# Patient Record
Sex: Male | Born: 1985 | Race: Black or African American | Hispanic: No | Marital: Single | Smoking: Never smoker
Health system: Southern US, Community
[De-identification: ages and names within clinical notes are randomized; demographics above are authoritative.]

## PROBLEM LIST (undated history)

## (undated) DIAGNOSIS — D573 Sickle-cell trait: Secondary | ICD-10-CM

## (undated) DIAGNOSIS — C966 Unifocal Langerhans-cell histiocytosis: Secondary | ICD-10-CM

## (undated) DIAGNOSIS — M7660 Achilles tendinitis, unspecified leg: Secondary | ICD-10-CM

## (undated) HISTORY — PX: CHOLECYSTECTOMY: SHX55

## (undated) HISTORY — PX: TYMPANOSTOMY TUBE PLACEMENT: SHX32

## (undated) HISTORY — PX: KNEE SURGERY: SHX244

---

## 2006-11-19 ENCOUNTER — Emergency Department (HOSPITAL_COMMUNITY): Admission: EM | Admit: 2006-11-19 | Discharge: 2006-11-19 | Payer: Self-pay | Admitting: Emergency Medicine

## 2006-11-22 ENCOUNTER — Emergency Department (HOSPITAL_COMMUNITY): Admission: EM | Admit: 2006-11-22 | Discharge: 2006-11-22 | Payer: Self-pay | Admitting: Emergency Medicine

## 2007-12-22 ENCOUNTER — Emergency Department (HOSPITAL_COMMUNITY): Admission: EM | Admit: 2007-12-22 | Discharge: 2007-12-22 | Payer: Self-pay | Admitting: Emergency Medicine

## 2008-10-27 ENCOUNTER — Emergency Department (HOSPITAL_COMMUNITY): Admission: EM | Admit: 2008-10-27 | Discharge: 2008-10-27 | Payer: Self-pay | Admitting: Emergency Medicine

## 2009-12-29 ENCOUNTER — Encounter: Admission: RE | Admit: 2009-12-29 | Discharge: 2009-12-29 | Payer: Self-pay | Admitting: Family Medicine

## 2011-04-12 ENCOUNTER — Ambulatory Visit
Admission: RE | Admit: 2011-04-12 | Discharge: 2011-04-12 | Disposition: A | Discharge status: Home / Self Care | Source: Ambulatory Visit | Attending: Emergency Medicine | Admitting: Emergency Medicine

## 2011-04-12 ENCOUNTER — Other Ambulatory Visit: Payer: Self-pay | Admitting: Emergency Medicine

## 2011-04-12 DIAGNOSIS — R52 Pain, unspecified: Secondary | ICD-10-CM

## 2013-06-21 ENCOUNTER — Encounter (HOSPITAL_COMMUNITY): Payer: Self-pay | Admitting: Emergency Medicine

## 2013-06-21 ENCOUNTER — Emergency Department (HOSPITAL_COMMUNITY)
Admission: EM | Admit: 2013-06-21 | Discharge: 2013-06-21 | Disposition: A | Discharge status: Home / Self Care | Attending: Emergency Medicine | Admitting: Emergency Medicine

## 2013-06-21 DIAGNOSIS — Z882 Allergy status to sulfonamides status: Secondary | ICD-10-CM | POA: Insufficient documentation

## 2013-06-21 DIAGNOSIS — R0982 Postnasal drip: Secondary | ICD-10-CM | POA: Insufficient documentation

## 2013-06-21 DIAGNOSIS — J029 Acute pharyngitis, unspecified: Secondary | ICD-10-CM

## 2013-06-21 DIAGNOSIS — R1310 Dysphagia, unspecified: Secondary | ICD-10-CM | POA: Insufficient documentation

## 2013-06-21 DIAGNOSIS — J309 Allergic rhinitis, unspecified: Secondary | ICD-10-CM | POA: Insufficient documentation

## 2013-06-21 MED ORDER — PREDNISONE 10 MG PO TABS: 60.0000 mg | ORAL_TABLET | Freq: Every day | ORAL | Status: DC

## 2013-06-21 MED ORDER — PREDNISONE 20 MG PO TABS
60.0000 mg | ORAL_TABLET | Freq: Once | ORAL | Status: AC
  Administered 2013-06-21: 60 mg via ORAL
  Filled 2013-06-21: qty 3

## 2013-06-21 NOTE — ED Notes (Signed)
Pt reports he was seen at his dentist on Wednesday, pt states his dentist reported seeing something in the back of his throat and wanted pt to have it evaluated. Pt does not remember what the dentist said it could be, pt denies any pain but does report difficulty swallowing certain foods such as meats.

## 2013-06-21 NOTE — ED Provider Notes (Signed)
CSN: [id number]     Arrival date & time 06/21/13  1524 History   First MD Initiated Contact with Patient 06/21/13 1603    This chart was scribed for Irish Elders NP, a non-physician practitioner working with No att. providers found by Lewanda Rife, ED Scribe. This patient was seen in room TR05C/TR05C and the patient's care was started at 10:45 PM     Chief Complaint  Patient presents with  . Sore Throat   (Consider location/radiation/quality/duration/timing/severity/associated sxs/prior Treatment) The history is provided by the patient. No language interpreter was used.   HPI Comments: Adam Prince is a 27 y.o. 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 Reports associated recent mild dysphagia "with meats", and improving recent URI. Denies any aggravating factors and alleviating factors. Denies associated any pain, fever, cough, sore throat, difficulty breathing, and chest pain. Denies PMHx of GERD or other pertinent hx. Reports seasonal allergies and allergies to Sulfa.   History reviewed. No pertinent past medical history. Past Surgical History  Procedure Laterality Date  . Knee surgery    . Tympanostomy tube placement     History reviewed. No pertinent family history. History  Substance Use Topics  . Smoking status: Never Smoker   . Smokeless tobacco: Not on file  . Alcohol Use: Yes     Comment: social    Review of Systems  Constitutional: Negative for fever.  HENT: Positive for sore throat.   All other systems reviewed and are negative.   A complete 10 system review of systems was obtained and all systems are negative except as noted in the HPI and PMHx.    Allergies  Sulfa antibiotics  Home Medications   Current Outpatient Rx  Name  Route  Sig  Dispense  Refill  . Ascorbic Acid (VITAMIN C PO)   Oral   Take 1 tablet by mouth daily.         . predniSONE  (DELTASONE) 10 MG tablet   Oral   Take 6 tablets (60 mg total) by mouth daily.   18 tablet   0    BP 117/87  Pulse 72  Temp(Src) 97.5 F (36.4 C) (Oral)  Resp 16  Ht 5\' 6"  (1.676 m)  Wt 163 lb (73.936 kg)  BMI 26.32 kg/m2  SpO2 100% Physical Exam  Nursing note and vitals reviewed. Constitutional: He is oriented to person, place, and time. He appears well-developed and well-nourished. No distress.  HENT:  Head: Normocephalic and atraumatic.  Right Ear: Tympanic membrane, external ear and ear canal normal.  Left Ear: External ear normal.  Mouth/Throat: Uvula is midline and mucous membranes are normal. No oral lesions. No trismus in the jaw. No uvula swelling. Posterior oropharyngeal edema (mild ) present. No oropharyngeal exudate, posterior oropharyngeal erythema or tonsillar abscesses.  Eyes: EOM are normal.  Neck: Neck supple. No tracheal deviation present.  Cardiovascular: Normal rate.   Pulmonary/Chest: Effort normal. No respiratory distress.  Musculoskeletal: Normal range of motion.  Lymphadenopathy:       Head (right side): No submental and no submandibular adenopathy present.       Head (left side): No submental and no submandibular adenopathy present.    He has no cervical adenopathy.  Neurological: He is alert and oriented to person, place, and time.  Skin: Skin is warm and dry.  Psychiatric: He has a normal mood and affect. His behavior is normal.  ED Course  Procedures  COORDINATION OF CARE:  Nursing notes reviewed. Vital signs reviewed. Initial pt interview and examination performed.  10:45 PM Pt informed of return precautions, f/u with ENT, and is comfortable with discharge at this time.    EKG Interpretation   None       MDM   1. Pharyngitis    Recent history of URI. No fever, chills, N/V/D or headache. Mild soreness described in his throat and he has had some post-nasal drip. Denies difficulty breathing, wheezing or shortness of breath.  Reports  some difficulty swallowing large bites of meat. Prednisone 60mg  given here and sent home with prescription for 3 days of prednisone. ENT referral, if not remarkably better after prednisone. Discussed eating soft foods and small bites of food. Return for any increased swelling, discomfort, fever, hoarseness or difficulty breathing. Pt understands plan and agrees.   I personally performed the services described in this documentation, which was scribed in my presence. The recorded information has been reviewed and is accurate.    Irish Elders, NP 06/21/13 2204  Adam Pound. Raahil Ong, MD 06/21/13 2245

## 2013-12-30 ENCOUNTER — Encounter (HOSPITAL_COMMUNITY): Payer: Self-pay | Admitting: Emergency Medicine

## 2013-12-30 ENCOUNTER — Emergency Department (INDEPENDENT_AMBULATORY_CARE_PROVIDER_SITE_OTHER)
Admission: EM | Admit: 2013-12-30 | Discharge: 2013-12-30 | Disposition: A | Discharge status: Home / Self Care | Source: Home / Self Care | Attending: Emergency Medicine | Admitting: Emergency Medicine

## 2013-12-30 DIAGNOSIS — J069 Acute upper respiratory infection, unspecified: Secondary | ICD-10-CM

## 2013-12-30 MED ORDER — IPRATROPIUM BROMIDE 0.06 % NA SOLN: 2.0000 | Freq: Four times a day (QID) | NASAL | Status: DC

## 2013-12-30 MED ORDER — DEXTROMETHORPHAN POLISTIREX 30 MG/5ML PO LQCR
30.0000 mg | Freq: Two times a day (BID) | ORAL | Status: DC
Start: 2013-12-30 — End: 2015-09-04

## 2013-12-30 NOTE — ED Notes (Signed)
Patient complains of head pressure, stuffy nose, sore throat, back ache, body aches.

## 2013-12-30 NOTE — Discharge Instructions (Signed)

## 2013-12-30 NOTE — ED Provider Notes (Signed)
CSN: [id number]     Arrival date & time 12/30/13  1747 History   First MD Initiated Contact with Patient 12/30/13 1929     Chief Complaint  Patient presents with  . 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 male presenting with URI. The history is provided by the patient.  URI Presenting symptoms: congestion, cough, rhinorrhea and sore throat   Severity:  Mild Onset quality:  Gradual Duration:  3 days Timing:  Constant Progression:  Unchanged Chronicity:  New Associated symptoms: headaches and myalgias   Associated symptoms: no arthralgias, no neck pain, no sinus pain, no sneezing, no swollen glands and no wheezing     History reviewed. No pertinent past medical history. Past Surgical History  Procedure Laterality Date  . Knee surgery    . Tympanostomy tube placement     No family history on file. History  Substance Use Topics  . Smoking status: Never Smoker   . Smokeless tobacco: Not on file  . Alcohol Use: Yes     Comment: social    Review of Systems  Constitutional: Negative.   HENT: Positive for congestion, rhinorrhea and sore throat. Negative for sneezing.   Eyes: Negative.   Respiratory: Positive for cough. Negative for wheezing.   Cardiovascular: Negative.   Gastrointestinal: Negative.   Genitourinary: Negative.   Musculoskeletal: Positive for myalgias. Negative for arthralgias and neck pain.  Skin: Negative.   Neurological: Positive for headaches.    Allergies  Sulfa antibiotics  Home Medications   Prior to Admission medications   Medication Sig Start Date End Date Taking? Authorizing Provider  Ascorbic Acid (VITAMIN C PO) Take 1 tablet by mouth daily.    Historical Provider, MD  dextromethorphan (DELSYM) 30 MG/5ML liquid Take 5 mLs (30 mg total) by mouth 2 (two) times daily. As needed for  cough 12/30/13   Annett Gula Lillyanne Bradburn, PA  ipratropium (ATROVENT) 0.06 % nasal spray Place 2 sprays into both nostrils 4 (four) times daily. As needed for nasal congestion 12/30/13   Lahoma Rocker, PA  predniSONE (DELTASONE) 10 MG tablet Take 6 tablets (60 mg total) by mouth daily. 06/21/13   Elisha Headland, NP   BP 116/79  Pulse 74  Temp(Src) 98.4 F (36.9 C) (Oral)  Resp 14  SpO2 98% Physical Exam  Nursing note and vitals reviewed. Constitutional: He is oriented to person, place, and time. He appears well-developed and well-nourished. No distress.  HENT:  Head: Normocephalic and atraumatic.  Right Ear: Hearing, tympanic membrane, external ear and ear canal normal.  Left Ear: Hearing, tympanic membrane, external ear and ear canal normal.  Nose: Nose normal.  Mouth/Throat: Uvula is midline, oropharynx is clear and moist and mucous membranes are normal. No oral lesions. No trismus in the jaw.  Eyes: Conjunctivae are normal. No scleral icterus.  Neck: Normal range of motion. Neck supple.  Cardiovascular: Normal rate, regular rhythm and normal heart sounds.   Pulmonary/Chest: Effort normal and breath sounds normal. No respiratory distress.  Musculoskeletal: Normal range of motion.  Lymphadenopathy:    He has no cervical adenopathy.  Neurological: He is alert and oriented to person, place, and time.  Skin: Skin is warm and dry. No rash noted. No erythema.  Psychiatric: He has a normal mood and affect. His behavior is normal.    ED Course  Procedures (including critical care time) Labs Review Labs Reviewed - No  data to display  Imaging Review No results found.   MDM   1. URI (upper respiratory infection)    Atrovent nasal spray as directed for nasal congestion, Delsym as directed on packaging for cough with symptomatic care at home.   [place], [place] 12/30/13 1958

## 2013-12-31 NOTE — ED Provider Notes (Signed)
Medical screening examination/treatment/procedure(s) were performed by non-physician practitioner and as supervising physician I was immediately available for consultation/collaboration.  Philipp Deputy, M.D.  Harden Mo, MD 12/31/13 [phone]

## 2014-07-25 DIAGNOSIS — M7660 Achilles tendinitis, unspecified leg: Secondary | ICD-10-CM

## 2014-07-25 HISTORY — DX: Achilles tendinitis, unspecified leg: M76.60

## 2014-12-15 ENCOUNTER — Emergency Department (HOSPITAL_COMMUNITY)
Admission: EM | Admit: 2014-12-15 | Discharge: 2014-12-15 | Disposition: A | Discharge status: Home / Self Care | Attending: Emergency Medicine | Admitting: Emergency Medicine

## 2014-12-15 ENCOUNTER — Emergency Department (HOSPITAL_COMMUNITY)

## 2014-12-15 ENCOUNTER — Encounter (HOSPITAL_COMMUNITY): Payer: Self-pay | Admitting: *Deleted

## 2014-12-15 DIAGNOSIS — N289 Disorder of kidney and ureter, unspecified: Secondary | ICD-10-CM | POA: Insufficient documentation

## 2014-12-15 DIAGNOSIS — R059 Cough, unspecified: Secondary | ICD-10-CM

## 2014-12-15 DIAGNOSIS — Z79899 Other long term (current) drug therapy: Secondary | ICD-10-CM | POA: Insufficient documentation

## 2014-12-15 DIAGNOSIS — R079 Chest pain, unspecified: Secondary | ICD-10-CM | POA: Diagnosis present

## 2014-12-15 DIAGNOSIS — R05 Cough: Secondary | ICD-10-CM | POA: Diagnosis not present

## 2014-12-15 LAB — COMPREHENSIVE METABOLIC PANEL
ALK PHOS: 79 U/L (ref 38–126)
ALT: 32 U/L (ref 17–63)
AST: 19 U/L (ref 15–41)
Albumin: 3.8 g/dL (ref 3.5–5.0)
Anion gap: 10 (ref 5–15)
BILIRUBIN TOTAL: 0.6 mg/dL (ref 0.3–1.2)
BUN: 8 mg/dL (ref 6–20)
CALCIUM: 8.9 mg/dL (ref 8.9–10.3)
CO2: 28 mmol/L (ref 22–32)
CREATININE: 1.34 mg/dL — AB (ref 0.61–1.24)
Chloride: 102 mmol/L (ref 101–111)
GFR calc Af Amer: >60 mL/min (ref >60)
Glucose, Bld: 70 mg/dL (ref 65–99)
POTASSIUM: 3.4 mmol/L — AB (ref 3.5–5.1)
SODIUM: 140 mmol/L (ref 135–145)
Total Protein: 6.2 g/dL — ABNORMAL LOW (ref 6.5–8.1)

## 2014-12-15 LAB — URINALYSIS, ROUTINE W REFLEX MICROSCOPIC
Bilirubin Urine: NEGATIVE
Glucose, UA: NEGATIVE mg/dL
Hgb urine dipstick: NEGATIVE
KETONES UR: NEGATIVE mg/dL
LEUKOCYTES UA: NEGATIVE
Nitrite: NEGATIVE
PROTEIN: NEGATIVE mg/dL
SPECIFIC GRAVITY, URINE: 1.009 (ref 1.005–1.030)
UROBILINOGEN UA: 0.2 mg/dL (ref 0.0–1.0)
pH: 7 (ref 5.0–8.0)

## 2014-12-15 LAB — CBC
HEMATOCRIT: 42.4 % (ref 39.0–52.0)
Hemoglobin: 14.4 g/dL (ref 13.0–17.0)
MCH: 28.8 pg (ref 26.0–34.0)
MCHC: 34 g/dL (ref 30.0–36.0)
MCV: 84.8 fL (ref 78.0–100.0)
PLATELETS: 594 10*3/uL — AB (ref 150–400)
RBC: 5 MIL/uL (ref 4.22–5.81)
RDW: 13.6 % (ref 11.5–15.5)
WBC: 10.3 10*3/uL (ref 4.0–10.5)

## 2014-12-15 LAB — TROPONIN I: Troponin I: <0.03 ng/mL (ref <0.031)

## 2014-12-15 NOTE — ED Notes (Addendum)
Pt c/o chest cough and sore throat x 2 weeks.  Presently denies sore throat, but c/o L sided chest pain that started after coughing.  Pt was seen at [facility] last week and given prednisone, cough medicine and an albuterol inhaler with no relief.

## 2014-12-15 NOTE — Discharge Instructions (Signed)
DASH Eating Plan °DASH stands for "Dietary Approaches to Stop Hypertension." The DASH eating plan is a healthy eating plan that has been shown to reduce high blood pressure (hypertension). Additional health benefits may include reducing the risk of type 2 diabetes mellitus, heart disease, and stroke. The DASH eating plan may also help with weight loss. °WHAT DO I NEED TO KNOW ABOUT THE DASH EATING PLAN? °For the DASH eating plan, you will follow these general guidelines: °· Choose foods with a percent daily value for sodium of less than 5% (as listed on the food label). °· Use salt-free seasonings or herbs instead of table salt or sea salt. °· Check with your health care provider or pharmacist before using salt substitutes. °· Eat lower-sodium products, often labeled as "lower sodium" or "no salt added." °· Eat fresh foods. °· Eat more vegetables, fruits, and low-fat dairy products. °· Choose whole grains. Look for the word "whole" as the first word in the ingredient list. °· Choose fish and skinless chicken or turkey more often than red meat. Limit fish, poultry, and meat to 6 oz (170 g) each day. °· Limit sweets, desserts, sugars, and sugary drinks. °· Choose heart-healthy fats. °· Limit cheese to 1 oz (28 g) per day. °· Eat more home-cooked food and less restaurant, buffet, and fast food. °· Limit fried foods. °· Cook foods using methods other than frying. °· Limit canned vegetables. If you do use them, rinse them well to decrease the sodium. °· When eating at a restaurant, ask that your food be prepared with less salt, or no salt if possible. °WHAT FOODS CAN I EAT? °Seek help from a dietitian for individual calorie needs. °Grains °Whole grain or whole wheat bread. Brown rice. Whole grain or whole wheat pasta. Quinoa, bulgur, and whole grain cereals. Low-sodium cereals. Corn or whole wheat flour tortillas. Whole grain cornbread. Whole grain crackers. Low-sodium crackers. °Vegetables °Fresh or frozen vegetables  (raw, steamed, roasted, or grilled). Low-sodium or reduced-sodium tomato and vegetable juices. Low-sodium or reduced-sodium tomato sauce and paste. Low-sodium or reduced-sodium canned vegetables.  °Fruits °All fresh, canned (in natural juice), or frozen fruits. °Meat and Other Protein Products °Ground beef (85% or leaner), grass-fed beef, or beef trimmed of fat. Skinless chicken or turkey. Ground chicken or turkey. Pork trimmed of fat. All fish and seafood. Eggs. Dried beans, peas, or lentils. Unsalted nuts and seeds. Unsalted canned beans. °Dairy °Low-fat dairy products, such as skim or 1% milk, 2% or reduced-fat cheeses, low-fat ricotta or cottage cheese, or plain low-fat yogurt. Low-sodium or reduced-sodium cheeses. °Fats and Oils °Tub margarines without trans fats. Light or reduced-fat mayonnaise and salad dressings (reduced sodium). Avocado. Safflower, olive, or canola oils. Natural peanut or almond butter. °Other °Unsalted popcorn and pretzels. °The items listed above may not be a complete list of recommended foods or beverages. Contact your dietitian for more options. °WHAT FOODS ARE NOT RECOMMENDED? °Grains °White bread. White pasta. White rice. Refined cornbread. Bagels and croissants. Crackers that contain trans fat. °Vegetables °Creamed or fried vegetables. Vegetables in a cheese sauce. Regular canned vegetables. Regular canned tomato sauce and paste. Regular tomato and vegetable juices. °Fruits °Dried fruits. Canned fruit in light or heavy syrup. Fruit juice. °Meat and Other Protein Products °Fatty cuts of meat. Ribs, chicken wings, bacon, sausage, bologna, salami, chitterlings, fatback, hot dogs, bratwurst, and packaged luncheon meats. Salted nuts and seeds. Canned beans with salt. °Dairy °Whole or 2% milk, cream, half-and-half, and cream cheese. Whole-fat or sweetened yogurt. Full-fat   cheeses or blue cheese. Nondairy creamers and whipped toppings. Processed cheese, cheese spreads, or cheese  curds. °Condiments °Onion and garlic salt, seasoned salt, table salt, and sea salt. Canned and packaged gravies. Worcestershire sauce. Tartar sauce. Barbecue sauce. Teriyaki sauce. Soy sauce, including reduced sodium. Steak sauce. Fish sauce. Oyster sauce. Cocktail sauce. Horseradish. Ketchup and mustard. Meat flavorings and tenderizers. Bouillon cubes. Hot sauce. Tabasco sauce. Marinades. Taco seasonings. Relishes. °Fats and Oils °Butter, stick margarine, lard, shortening, ghee, and bacon fat. Coconut, palm kernel, or palm oils. Regular salad dressings. °Other °Pickles and olives. Salted popcorn and pretzels. °The items listed above may not be a complete list of foods and beverages to avoid. Contact your dietitian for more information. °WHERE CAN I FIND MORE INFORMATION? °National [employer], [employer], [employer] Institute: www.nhlbi.nih.gov/health/health-topics/topics/dash/ °Document Released: 06/30/2011 Document Revised: 11/25/2013 Document Reviewed: 05/15/2013 °Exit[employer]® Patient Information ©2015 [employer]. This information is not intended to replace advice given to you by your health care provider. Make sure you discuss any questions you have with your health care provider. ° °

## 2014-12-15 NOTE — ED Provider Notes (Addendum)
CSN: [id number]     Arrival date & time 12/15/14  1504 History   First MD Initiated Contact with Patient 12/15/14 1610     Chief Complaint  Patient presents with  . Cough  . Chest Pain      HPI  Expand All Collapse All   Pt c/o chest cough and sore throat x 2 weeks. Presently denies sore throat, but c/o L sided chest pain that started after coughing. Pt was seen at [facility] last week and given prednisone, cough medicine and an albuterol inhaler with no relief.        History reviewed. No pertinent past medical history. Past Surgical History  Procedure Laterality Date  . Knee surgery    . Tympanostomy tube placement     No family history on file. History  Substance Use Topics  . Smoking status: Never Smoker   . Smokeless tobacco: Not on file  . Alcohol Use: Yes     Comment: social    Review of Systems  All other systems reviewed and are negative  Allergies  Sulfa antibiotics  Home Medications   Prior to Admission medications   Medication Sig Start Date End Date Taking? Authorizing Provider  albuterol (PROVENTIL HFA;VENTOLIN HFA) 108 (90 BASE) MCG/ACT inhaler Inhale into the lungs every 6 (six) hours as needed for wheezing or shortness of breath.   Yes Historical Provider, MD  dextromethorphan (DELSYM) 30 MG/5ML liquid Take 5 mLs (30 mg total) by mouth 2 (two) times daily. As needed for cough 12/30/13  Yes Audelia Hives Presson, PA  ipratropium (ATROVENT) 0.06 % nasal spray Place 2 sprays into both nostrils 4 (four) times daily. As needed for nasal congestion Patient not taking: Reported on 12/15/2014 12/30/13   Audelia Hives Presson, PA  predniSONE (DELTASONE) 10 MG tablet Take 6 tablets (60 mg total) by mouth daily. Patient not taking: Reported on 12/15/2014 06/21/13   Elisha Headland, NP   BP 129/83 mmHg  Pulse 57  Temp(Src) 98.2 F (36.8 C) (Oral)  Resp 17  Ht 5\' 6"  (1.676 m)  Wt 195 lb (88.451 kg)  BMI 31.49 kg/m2  SpO2 100% Physical Exam Physical Exam  Nursing  note and vitals reviewed. Constitutional: He is oriented to person, place, and time. He appears well-developed and well-nourished. No distress.  HENT:  Head: Normocephalic and atraumatic.  Eyes: Pupils are equal, round, and reactive to light.  Neck: Normal range of motion.  Cardiovascular: Normal rate and intact distal pulses.   Pulmonary/Chest: No respiratory distress.  Abdominal: Normal appearance. He exhibits no distension.  Musculoskeletal: Normal range of motion.  Neurological: He is alert and oriented to person, place, and time. No cranial nerve deficit.  Skin: Skin is warm and dry. No rash noted.  Psychiatric: He has a normal mood and affect. His behavior is normal.   ED Course  Procedures (including critical care time) Labs Review Labs Reviewed  CBC - Abnormal; Notable for the following:    Platelets 594 (*)    All other components within normal limits  COMPREHENSIVE METABOLIC PANEL - Abnormal; Notable for the following:    Potassium 3.4 (*)    Creatinine, Ser 1.34 (*)    Total Protein 6.2 (*)    All other components within normal limits  TROPONIN I  URINALYSIS, ROUTINE W REFLEX MICROSCOPIC    Imaging Review Dg Chest 2 View  12/15/2014   CLINICAL DATA:  Cough, left side chest pain, sore throat  EXAM: CHEST  2 VIEW  COMPARISON:  None.  FINDINGS: Cardiomediastinal silhouette is unremarkable. No acute infiltrate or pleural effusion. No pulmonary edema. Bony thorax is unremarkable.  IMPRESSION: No active cardiopulmonary disease.   Electronically Signed   By: Lahoma Crocker M.D.   On: 12/15/2014 16:00     I discussed this case with nephrology who recommended we elected urine and have him follow-up with private medical doctor.  He was given referral for follow-up of his renal function and blood pressures. MDM   Final diagnoses:  Cough  Renal insufficiency        Leonard Schwartz, MD 12/15/14 1812  Leonard Schwartz, MD 12/15/14 [phone]

## 2015-07-01 ENCOUNTER — Encounter (HOSPITAL_COMMUNITY): Payer: Self-pay | Admitting: Emergency Medicine

## 2015-07-01 ENCOUNTER — Emergency Department (HOSPITAL_COMMUNITY)
Admission: EM | Admit: 2015-07-01 | Discharge: 2015-07-01 | Disposition: A | Discharge status: Home / Self Care | Attending: Emergency Medicine | Admitting: Emergency Medicine

## 2015-07-01 DIAGNOSIS — H578 Other specified disorders of eye and adnexa: Secondary | ICD-10-CM | POA: Diagnosis present

## 2015-07-01 DIAGNOSIS — Z79899 Other long term (current) drug therapy: Secondary | ICD-10-CM | POA: Diagnosis not present

## 2015-07-01 DIAGNOSIS — H109 Unspecified conjunctivitis: Secondary | ICD-10-CM | POA: Insufficient documentation

## 2015-07-01 MED ORDER — TOBRAMYCIN-DEXAMETHASONE 0.3-0.1 % OP SUSP: 1.0000 [drp] | OPHTHALMIC | Status: DC

## 2015-07-01 NOTE — ED Notes (Signed)
Pt c/o bilat reddened eyes with drainage.  Pt states that they are matted shut when he wakes up. Pt states that he has little blurred vision. Pt wears glasses and contacts.

## 2015-07-01 NOTE — Discharge Instructions (Signed)
Bacterial Conjunctivitis °Bacterial conjunctivitis, commonly called pink eye, is an inflammation of the clear membrane that covers the white part of the eye (conjunctiva). The inflammation can also happen on the underside of the eyelids. The blood vessels in the conjunctiva become inflamed, causing the eye to become red or pink. Bacterial conjunctivitis may spread easily from one eye to another and from person to person (contagious).  °CAUSES  °Bacterial conjunctivitis is caused by bacteria. The bacteria may come from your own skin, your upper respiratory tract, or from someone else with bacterial conjunctivitis. °SYMPTOMS  °The normally white color of the eye or the underside of the eyelid is usually pink or red. The pink eye is usually associated with irritation, tearing, and some sensitivity to light. Bacterial conjunctivitis is often associated with a thick, yellowish discharge from the eye. The discharge may turn into a crust on the eyelids overnight, which causes your eyelids to stick together. If a discharge is present, there may also be some blurred vision in the affected eye. °DIAGNOSIS  °Bacterial conjunctivitis is diagnosed by your caregiver through an eye exam and the symptoms that you report. Your caregiver looks for changes in the surface tissues of your eyes, which may point to the specific type of conjunctivitis. A sample of any discharge may be collected on a cotton-tip swab if you have a severe case of conjunctivitis, if your cornea is affected, or if you keep getting repeat infections that do not respond to treatment. The sample will be sent to a lab to see if the inflammation is caused by a bacterial infection and to see if the infection will respond to antibiotic medicines. °TREATMENT  °1. Bacterial conjunctivitis is treated with antibiotics. Antibiotic eyedrops are most often used. However, antibiotic ointments are also available. Antibiotics pills are sometimes used. Artificial tears or eye  washes may ease discomfort. °HOME CARE INSTRUCTIONS  °1. To ease discomfort, apply a cool, clean washcloth to your eye for 10-20 minutes, 3-4 times a day. °2. Gently wipe away any drainage from your eye with a warm, wet washcloth or a cotton ball. °3. Wash your hands often with soap and water. Use paper towels to dry your hands. °4. Do not share towels or washcloths. This may spread the infection. °5. Change or wash your pillowcase every day. °6. You should not use eye makeup until the infection is gone. °7. Do not operate machinery or drive if your vision is blurred. °8. Stop using contact lenses. Ask your caregiver how to sterilize or replace your contacts before using them again. This depends on the type of contact lenses that you use. °9. When applying medicine to the infected eye, do not touch the edge of your eyelid with the eyedrop bottle or ointment tube. °SEEK IMMEDIATE MEDICAL CARE IF:  °· Your infection has not improved within 3 days after beginning treatment. °· You had yellow discharge from your eye and it returns. °· You have increased eye pain. °· Your eye redness is spreading. °· Your vision becomes blurred. °· You have a fever or persistent symptoms for more than 2-3 days. °· You have a fever and your symptoms suddenly get worse. °· You have facial pain, redness, or swelling. °MAKE SURE YOU:  °· Understand these instructions. °· Will watch your condition. °· Will get help right away if you are not doing well or get worse. °  °This information is not intended to replace advice given to you by your health care provider. Make sure you   discuss any questions you have with your health care provider. °  °Document Released: 07/11/2005 Document Revised: 08/01/2014 Document Reviewed: 12/12/2011 °Elsevier Interactive Patient Education ©2016 [employer] ° °How to Use Eye Drops and Eye Ointments °HOW TO APPLY EYE DROPS °Follow these steps when applying eye drops: °2. Wash your hands. °3. Tilt your head  back. °4. Put a finger under your eye and use it to gently pull your lower lid downward. Keep that finger in place. °5. Using your other hand, hold the dropper between your thumb and index finger. °6. Position the dropper just over the edge of the lower lid. Hold it as close to your eye as you can without touching the dropper to your eye. °7. Steady your hand. One way to do this is to lean your index finger against your brow. °8. Look up. °9. Slowly and gently squeeze one drop of medicine into your eye. °10. Close your eye. °11. Place a finger between your lower eyelid and your nose. Press gently for 2 minutes. This increases the amount of time that the medicine is exposed to the eye. It also reduces side effects that can develop if the drop gets into the bloodstream through the nose. °HOW TO APPLY EYE OINTMENTS °Follow these steps when applying eye ointments: °10. Wash your hands. °11. Put a finger under your eye and use it to gently pull your lower lid downward. Keep that finger in place. °12. Using your other hand, place the tip of the tube between your thumb and index finger with the remaining fingers braced against your cheek or nose. °13. Hold the tube just over the edge of your lower lid without touching the tube to your lid or eyeball. °14. Look up. °15. Line the inner part of your lower lid with ointment. °16. Gently pull up on your upper lid and look down. This will force the ointment to spread over the surface of the eye. °17. Release the upper lid. °18. If you can, close your eyes for 1-2 minutes. °Do not rub your eyes. If you applied the ointment correctly, your vision will be blurry for a few minutes. This is normal. °ADDITIONAL INFORMATION °· Make sure to use the eye drops or ointment as told by your health care provider. °· If you have been told to use both eye drops and an eye ointment, apply the eye drops first, then wait 3-4 minutes before you apply the ointment. °· Try not to touch the tip of the  dropper or tube to your eye. A dropper or tube that has touched the eye can become contaminated. °  °This information is not intended to replace advice given to you by your health care provider. Make sure you discuss any questions you have with your health care provider. °  °Document Released: 10/17/2000 Document Revised: 11/25/2014 Document Reviewed: 07/07/2014 °Elsevier Interactive Patient Education ©2016 [employer] ° °

## 2015-07-01 NOTE — ED Provider Notes (Signed)
CSN: [id number]     Arrival date & time 07/01/15  0806 History   First MD Initiated Contact with Patient 07/01/15 [phone]     Chief Complaint  Patient presents with  . Conjunctivitis     HPI  Patient presents for evaluation of bilateral red eyes.  Patient wears contacts. They're one month contacts, disposable. Symptoms for 2 days. Awakened with red eyes. Yellow drainage and mattering. Hasn't slept his contacts 2 nights before. However, his symptoms have persisted. Interestingly his roommate has similar symptoms as well. No allergy symptoms. No vision changes blurring or doubling.  History reviewed. No pertinent past medical history. Past Surgical History  Procedure Laterality Date  . Knee surgery    . Tympanostomy tube placement     No family history on file. Social History  Substance Use Topics  . Smoking status: Never Smoker   . Smokeless tobacco: None  . Alcohol Use: Yes     Comment: social    Review of Systems  Constitutional: Negative for fever, chills, diaphoresis, appetite change and fatigue.  HENT: Negative for mouth sores, sore throat and trouble swallowing.   Eyes: Positive for pain, discharge, redness and itching. Negative for photophobia and visual disturbance.  Respiratory: Negative for cough, chest tightness, shortness of breath and wheezing.   Cardiovascular: Negative for chest pain.  Gastrointestinal: Negative for nausea, vomiting, abdominal pain, diarrhea and abdominal distention.  Endocrine: Negative for polydipsia, polyphagia and polyuria.  Genitourinary: Negative for dysuria, frequency and hematuria.  Musculoskeletal: Negative for gait problem.  Skin: Negative for color change, pallor and rash.  Neurological: Negative for dizziness, syncope, light-headedness and headaches.  Hematological: Does not bruise/bleed easily.  Psychiatric/Behavioral: Negative for behavioral problems and confusion.      Allergies  Sulfa antibiotics  Home Medications   Prior  to Admission medications   Medication Sig Start Date End Date Taking? Authorizing Provider  albuterol (PROVENTIL HFA;VENTOLIN HFA) 108 (90 BASE) MCG/ACT inhaler Inhale into the lungs every 6 (six) hours as needed for wheezing or shortness of breath.    Historical Provider, MD  dextromethorphan (DELSYM) 30 MG/5ML liquid Take 5 mLs (30 mg total) by mouth 2 (two) times daily. As needed for cough 12/30/13   Audelia Hives Presson, PA  ipratropium (ATROVENT) 0.06 % nasal spray Place 2 sprays into both nostrils 4 (four) times daily. As needed for nasal congestion Patient not taking: Reported on 12/15/2014 12/30/13   Audelia Hives Presson, PA  predniSONE (DELTASONE) 10 MG tablet Take 6 tablets (60 mg total) by mouth daily. Patient not taking: Reported on 12/15/2014 06/21/13   Elisha Headland, NP  tobramycin-dexamethasone [facility]) ophthalmic solution Place 1 drop into both eyes every 4 (four) hours while awake. 07/01/15   Tanna Furry, MD   BP 112/83 mmHg  Pulse 67  Temp(Src) 98.3 F (36.8 C) (Oral)  Resp 17  Ht 5\' 6"  (1.676 m)  Wt 185 lb (83.915 kg)  BMI 29.87 kg/m2  SpO2 100% Physical Exam  Constitutional: He is oriented to person, place, and time. He appears well-developed and well-nourished. No distress.  HENT:  Head: Normocephalic.  Eyes: Pupils are equal, round, and reactive to light. Right eye exhibits discharge. Right eye exhibits no chemosis. Left eye exhibits discharge. Left eye exhibits no chemosis. Right conjunctiva is injected. Left conjunctiva is injected. No scleral icterus.  Neck: Normal range of motion. Neck supple. No thyromegaly present.  Cardiovascular: Normal rate and regular rhythm.  Exam reveals no gallop and no friction rub.  No murmur heard. Pulmonary/Chest: Effort normal and breath sounds normal. No respiratory distress. He has no wheezes. He has no rales.  Abdominal: Soft. Bowel sounds are normal. He exhibits no distension. There is no tenderness. There is no rebound.   Musculoskeletal: Normal range of motion.  Neurological: He is alert and oriented to person, place, and time.  Skin: Skin is warm and dry. No rash noted.  Psychiatric: He has a normal mood and affect. His behavior is normal.    ED Course  Procedures (including critical care time) Labs Review Labs Reviewed - No data to display  Imaging Review No results found. I have personally reviewed and evaluated these images and lab results as part of my medical decision-making.   EKG Interpretation None      MDM   Final diagnoses:  Bilateral conjunctivitis    Advised to not wear his contacts. He had several days of normal-appearing and feeling eyes before utilizing his contacts again. Primary care follow-up as needed.    Tanna Furry, MD 07/01/15 [phone]

## 2015-07-01 NOTE — ED Notes (Signed)
Bed: WA05 Expected date:  Expected time:  Means of arrival:  Comments: 

## 2015-07-17 ENCOUNTER — Emergency Department (HOSPITAL_COMMUNITY)
Admission: EM | Admit: 2015-07-17 | Discharge: 2015-07-17 | Disposition: A | Discharge status: Home / Self Care | Attending: Emergency Medicine | Admitting: Emergency Medicine

## 2015-07-17 ENCOUNTER — Encounter (HOSPITAL_COMMUNITY): Payer: Self-pay

## 2015-07-17 DIAGNOSIS — Z79899 Other long term (current) drug therapy: Secondary | ICD-10-CM | POA: Diagnosis not present

## 2015-07-17 DIAGNOSIS — H109 Unspecified conjunctivitis: Secondary | ICD-10-CM | POA: Diagnosis not present

## 2015-07-17 DIAGNOSIS — H5711 Ocular pain, right eye: Secondary | ICD-10-CM | POA: Diagnosis present

## 2015-07-17 MED ORDER — LEVOFLOXACIN 0.5 % OP SOLN: OPHTHALMIC | Status: DC

## 2015-07-17 MED ORDER — TETRACAINE HCL 0.5 % OP SOLN
2.0000 [drp] | Freq: Once | OPHTHALMIC | Status: DC
  Filled 2015-07-17: qty 4

## 2015-07-17 MED ORDER — FLUORESCEIN SODIUM 1 MG OP STRP
1.0000 | ORAL_STRIP | Freq: Once | OPHTHALMIC | Status: DC
  Filled 2015-07-17: qty 1

## 2015-07-17 NOTE — ED Notes (Signed)
Pt c/o R eye drainage and discomfort x 3 days.  Pain score 9/10.  Pt was diagnosed w/ bilateral conjunctivitis "a few weeks ago."  Pt reports that those symptoms resolved.

## 2015-07-17 NOTE — Progress Notes (Addendum)
Right eye with reddness. Visual acuity performed : Right eye w/o a contact and visual acuity 20/200. Left eye is 20/15 with a contact. Pt states he just got over pink eye two weeks ago.

## 2015-07-17 NOTE — ED Provider Notes (Signed)
CSN: [id number]     Arrival date & time 07/17/15  1741 History  By signing my name below, I, Jolayne Panther, attest that this documentation has been prepared under the direction and in the presence of Glendell Docker, NP. Electronically Signed: Jolayne Panther, Scribe. 07/17/2015. 6:12 PM.    Chief Complaint  Patient presents with  . Eye Problem  . Eye Drainage    The history is provided by the patient. No language interpreter was used.    HPI Comments: Adam Prince is a 29 y.o. male who presents to the Emergency Department complaining of constant, mild, right eye pain with associated tearing and clear drainage onset three days ago. Pt notes that he had pink eye three weeks ago. He was prescribed eye drops which resolved the pink eye. He can not recall the exact name of the eye drops he was given. Pt states that he took out his right contact last night and has not worn it since. He notes that when he woke up this morning he felt like someone had poked him in his right eye. Pt denies eye discharge and visual disturbance. He is allergic to sulfa.pt states that it feels like he poked himself in the eye.  History reviewed. No pertinent past medical history. Past Surgical History  Procedure Laterality Date  . Knee surgery    . Tympanostomy tube placement     History reviewed. No pertinent family history. Social History  Substance Use Topics  . Smoking status: Never Smoker   . Smokeless tobacco: None  . Alcohol Use: Yes     Comment: social    Review of Systems  All other systems reviewed and are negative.  Allergies  Sulfa antibiotics  Home Medications   Prior to Admission medications   Medication Sig Start Date End Date Taking? Authorizing Provider  albuterol (PROVENTIL HFA;VENTOLIN HFA) 108 (90 BASE) MCG/ACT inhaler Inhale into the lungs every 6 (six) hours as needed for wheezing or shortness of breath.    Historical Provider, MD  dextromethorphan (DELSYM) 30  MG/5ML liquid Take 5 mLs (30 mg total) by mouth 2 (two) times daily. As needed for cough 12/30/13   Audelia Hives Presson, PA  ipratropium (ATROVENT) 0.06 % nasal spray Place 2 sprays into both nostrils 4 (four) times daily. As needed for nasal congestion Patient not taking: Reported on 12/15/2014 12/30/13   Audelia Hives Presson, PA  predniSONE (DELTASONE) 10 MG tablet Take 6 tablets (60 mg total) by mouth daily. Patient not taking: Reported on 12/15/2014 06/21/13   Elisha Headland, NP  tobramycin-dexamethasone [facility]) ophthalmic solution Place 1 drop into both eyes every 4 (four) hours while awake. 07/01/15   Tanna Furry, MD   BP 135/87 mmHg  Pulse 68  Temp(Src) 97.7 F (36.5 C) (Oral)  Resp 16  SpO2 100% Physical Exam  Constitutional: He is oriented to person, place, and time. He appears well-developed and well-nourished. No distress.  HENT:  Head: Normocephalic.  Right Ear: External ear normal.  Left Ear: External ear normal.  Eyes: EOM are normal. Pupils are equal, round, and reactive to light. Right conjunctiva is injected.  Slit lamp exam:      The right eye shows no corneal abrasion, no corneal ulcer, no foreign body and no fluorescein uptake.  Cardiovascular: Normal rate.   Pulmonary/Chest: Effort normal and breath sounds normal.  Abdominal: He exhibits no distension.  Neurological: He is alert and oriented to person, place, and time.  Skin: Skin  is warm and dry.  Psychiatric: He has a normal mood and affect.  Nursing note and vitals reviewed.   ED Course  Procedures  DIAGNOSTIC STUDIES:    Oxygen Saturation is 100% on RA, normal by my interpretation.   COORDINATION OF CARE:  6:06 PM Will apply numbing drops and dye in pt's eye to examine right eye. Discussed treatment plan with pt at bedside and pt agreed to plan.    MDM   Final diagnoses:  Conjunctivitis of right eye    Pt is okay to follow up with opthomology. Like conjuncitivitis. Given levofloxacin since pt  wears contact. Given optho follow up as needed  I personally performed the services described in this documentation, which was scribed in my presence. The recorded information has been reviewed and is accurate.   Glendell Docker, NP 07/17/15 1846  Wandra Arthurs, MD 07/17/15 2241

## 2015-07-17 NOTE — Discharge Instructions (Signed)

## 2015-09-04 ENCOUNTER — Encounter (HOSPITAL_COMMUNITY): Payer: Self-pay | Admitting: Nurse Practitioner

## 2015-09-04 ENCOUNTER — Encounter: Payer: Self-pay | Admitting: Family Medicine

## 2015-09-04 ENCOUNTER — Ambulatory Visit (INDEPENDENT_AMBULATORY_CARE_PROVIDER_SITE_OTHER): Admitting: Family Medicine

## 2015-09-04 ENCOUNTER — Emergency Department (HOSPITAL_COMMUNITY)
Admission: EM | Admit: 2015-09-04 | Discharge: 2015-09-04 | Disposition: A | Discharge status: Home / Self Care | Attending: Emergency Medicine | Admitting: Emergency Medicine

## 2015-09-04 ENCOUNTER — Emergency Department (HOSPITAL_COMMUNITY)

## 2015-09-04 VITALS — BP 114/80 | HR 64 | Wt 197.2 lb

## 2015-09-04 DIAGNOSIS — Y92310 Basketball court as the place of occurrence of the external cause: Secondary | ICD-10-CM | POA: Insufficient documentation

## 2015-09-04 DIAGNOSIS — Z8739 Personal history of other diseases of the musculoskeletal system and connective tissue: Secondary | ICD-10-CM | POA: Insufficient documentation

## 2015-09-04 DIAGNOSIS — S8992XD Unspecified injury of left lower leg, subsequent encounter: Secondary | ICD-10-CM

## 2015-09-04 DIAGNOSIS — Z8639 Personal history of other endocrine, nutritional and metabolic disease: Secondary | ICD-10-CM

## 2015-09-04 DIAGNOSIS — R748 Abnormal levels of other serum enzymes: Secondary | ICD-10-CM | POA: Diagnosis not present

## 2015-09-04 DIAGNOSIS — Z7189 Other specified counseling: Secondary | ICD-10-CM | POA: Diagnosis not present

## 2015-09-04 DIAGNOSIS — S8992XA Unspecified injury of left lower leg, initial encounter: Secondary | ICD-10-CM | POA: Insufficient documentation

## 2015-09-04 DIAGNOSIS — G479 Sleep disorder, unspecified: Secondary | ICD-10-CM

## 2015-09-04 DIAGNOSIS — M899 Disorder of bone, unspecified: Secondary | ICD-10-CM | POA: Diagnosis not present

## 2015-09-04 DIAGNOSIS — Y998 Other external cause status: Secondary | ICD-10-CM | POA: Diagnosis not present

## 2015-09-04 DIAGNOSIS — X58XXXA Exposure to other specified factors, initial encounter: Secondary | ICD-10-CM | POA: Diagnosis not present

## 2015-09-04 DIAGNOSIS — R51 Headache: Secondary | ICD-10-CM | POA: Diagnosis not present

## 2015-09-04 DIAGNOSIS — D473 Essential (hemorrhagic) thrombocythemia: Secondary | ICD-10-CM | POA: Diagnosis not present

## 2015-09-04 DIAGNOSIS — Y9339 Activity, other involving climbing, rappelling and jumping off: Secondary | ICD-10-CM | POA: Insufficient documentation

## 2015-09-04 DIAGNOSIS — R519 Headache, unspecified: Secondary | ICD-10-CM

## 2015-09-04 DIAGNOSIS — R7989 Other specified abnormal findings of blood chemistry: Secondary | ICD-10-CM

## 2015-09-04 DIAGNOSIS — M25562 Pain in left knee: Secondary | ICD-10-CM

## 2015-09-04 DIAGNOSIS — Z7689 Persons encountering health services in other specified circumstances: Secondary | ICD-10-CM

## 2015-09-04 HISTORY — DX: Unifocal Langerhans-cell histiocytosis: C96.6

## 2015-09-04 HISTORY — DX: Achilles tendinitis, unspecified leg: M76.60

## 2015-09-04 LAB — COMPREHENSIVE METABOLIC PANEL
ALBUMIN: 4.5 g/dL (ref 3.6–5.1)
ALT: 40 U/L (ref 9–46)
AST: 28 U/L (ref 10–40)
Alkaline Phosphatase: 72 U/L (ref 40–115)
BUN: 9 mg/dL (ref 7–25)
CHLORIDE: 105 mmol/L (ref 98–110)
CO2: 26 mmol/L (ref 20–31)
CREATININE: 1.37 mg/dL — AB (ref 0.60–1.35)
Calcium: 9.6 mg/dL (ref 8.6–10.3)
Glucose, Bld: 90 mg/dL (ref 65–99)
POTASSIUM: 4.5 mmol/L (ref 3.5–5.3)
SODIUM: 141 mmol/L (ref 135–146)
TOTAL PROTEIN: 7 g/dL (ref 6.1–8.1)
Total Bilirubin: 0.5 mg/dL (ref 0.2–1.2)

## 2015-09-04 LAB — CBC WITH DIFFERENTIAL/PLATELET
BASOS PCT: 1 % (ref 0–1)
Basophils Absolute: 0.1 10*3/uL (ref 0.0–0.1)
EOS ABS: 0.4 10*3/uL (ref 0.0–0.7)
Eosinophils Relative: 6 % — ABNORMAL HIGH (ref 0–5)
HCT: 43.4 % (ref 39.0–52.0)
HEMOGLOBIN: 14.7 g/dL (ref 13.0–17.0)
LYMPHS ABS: 1.8 10*3/uL (ref 0.7–4.0)
Lymphocytes Relative: 26 % (ref 12–46)
MCH: 29.1 pg (ref 26.0–34.0)
MCHC: 33.9 g/dL (ref 30.0–36.0)
MCV: 85.8 fL (ref 78.0–100.0)
MONO ABS: 0.7 10*3/uL (ref 0.1–1.0)
MONOS PCT: 10 % (ref 3–12)
MPV: 10.3 fL (ref 8.6–12.4)
NEUTROS ABS: 4 10*3/uL (ref 1.7–7.7)
Neutrophils Relative %: 57 % (ref 43–77)
Platelets: 691 10*3/uL — ABNORMAL HIGH (ref 150–400)
RBC: 5.06 MIL/uL (ref 4.22–5.81)
RDW: 13.9 % (ref 11.5–15.5)
WBC: 7.1 10*3/uL (ref 4.0–10.5)

## 2015-09-04 MED ORDER — IBUPROFEN 200 MG PO TABS
600.0000 mg | ORAL_TABLET | Freq: Once | ORAL | Status: AC
  Administered 2015-09-04: 600 mg via ORAL
  Filled 2015-09-04: qty 3

## 2015-09-04 NOTE — ED Notes (Signed)
Patient was playing basketball yesterday and felt a pop, pain, and weakness in his left knee, localizes to below patella. Has continued to experience pain, rates 10/10, and experiencing difficulty walking. Has torn his right MCL in the past and states it feels like that. Tried taking 400 mg ibuprofen, ice, and elevation last night without relief.

## 2015-09-04 NOTE — ED Provider Notes (Signed)
CSN: [id number]     Arrival date & time 09/04/15  0732 History   First MD Initiated Contact with Patient 09/04/15 [phone]     Chief Complaint  Patient presents with  . Knee Injury    left knee   (Consider location/radiation/quality/duration/timing/severity/associated sxs/prior Treatment) The history is provided by the patient. No language interpreter was used.   Adam Prince is a 30 y.o male with a history of achilles tendonitis and torn right MCL who presents for sudden onset left knee pain while jumping up to make a basket. He states he felt a pop and immediately had to go and sit down. He hopped to the sideline. He states he has been limping around since. Took 400mg  ibuprofen last night at 7 PM for pain. States the pain is below the patella. Has injured this knee 2 previous times with similar pain.  Past Medical History  Diagnosis Date  . Achilles tendonitis 2016    right leg  . 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 Knee surgery    . Tympanostomy tube placement     History reviewed. No pertinent family history. Social History  Substance Use Topics  . Smoking status: Never Smoker   . Smokeless tobacco: None  . Alcohol Use: Yes     Comment: social    Review of Systems  Musculoskeletal: Positive for myalgias, arthralgias and gait problem. Negative for joint swelling.  Neurological: Negative for numbness.      Allergies  Sulfa antibiotics  Home Medications   Prior to Admission medications   Medication Sig Start Date End Date Taking? Authorizing Provider  ibuprofen (ADVIL,MOTRIN) 200 MG tablet Take 400 mg by mouth every 6 (six) hours as needed for headache, mild pain or moderate pain.   Yes Historical Provider, MD   BP 133/70 mmHg  Pulse 65  Temp(Src) 97.6 F (36.4 C) (Oral)  Resp 15  SpO2 97% Physical Exam  Constitutional: He is oriented to person, place, and time. He appears  well-developed and well-nourished. No distress.  HENT:  Head: Normocephalic and atraumatic.  Eyes: Conjunctivae are normal.  Neck: Normal range of motion. Neck supple.  Cardiovascular: Normal rate.   Pulmonary/Chest: Effort normal.  Musculoskeletal: Normal range of motion. He exhibits tenderness. He exhibits no edema.  Left knee: Tenderness and edema of tibial tuberosity. No patella, medial, lateral, or posterior knee pain. No fibular head pain. 2+ DP pulse. Able to plantar flex. Pain with dorsiflexion. Able to flex knee to 45. Able to straight leg raise.  Neurological: He is alert and oriented to person, place, and time.  Skin: Skin is warm and dry.  Psychiatric: He has a normal mood and affect.  Nursing note and vitals reviewed.   ED Course  Procedures (including critical care time) Labs Review Labs Reviewed - No data to display  Imaging Review Dg Knee Complete 4 Views Left  09/04/2015  CLINICAL DATA:  Fall.  Initial evaluation. EXAM: LEFT KNEE - COMPLETE 4+ VIEW COMPARISON:  No prior . FINDINGS: No acute bony or joint abnormality identified. Ill-defined lucency with faintly sclerotic margins noted along the posterior medial aspect of the distal left femur. 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 IMPRESSION: 1.  No acute abnormality. 2. Ill-defined lucency with faintly sclerotic margins noted along posterior medial aspect  of the distal femur. 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 Electronically Signed   By: Marcello Moores  Register   On: 09/04/2015 08:25   I have personally reviewed and evaluated these image results as part of my medical decision-making.   EKG Interpretation None      MDM   Final diagnoses:  Left knee pain   Patient  presents for left knee pain while playing basketball.  Focal tenderness at the tibial tuberosity with swelling.  No concerns for quad tendon rupture. Patient ambulatory with limp. 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 An MRI is recommended. I do not believe the patient needs further imaging at this time while in the ED and this can be done outpatient. I explained this to the patient. Patient was given knee sleeve and crutches. Patient agrees with follow-up and plan.    Adam Glazier, PA-C 09/05/15 [id number]  Quintella Reichert, MD 09/05/15 [phone]

## 2015-09-04 NOTE — Progress Notes (Signed)
Subjective:    Patient ID: Adam Prince, male    DOB: [birth year], 30 y.o.   MRN: [id number]  HPI Chief Complaint  Patient presents with  . new pt    new pt, knee pain. wants MRI to look at spot.    He is here to establish care. He has not had a PCP in a long time. He is in [employer] and is in [place] at his base often. He has been getting check ups and seen for acute visits there.  He injured his left knee yesterday while playing basketball and was seen at the emergency department earlier today for this. States his left knee only hurts with lateral movements and with full extension. He did report a pop at the initial injury but denies locking, popping, giving way sensation.  He denies numbness, tingling, weakness to his lower extremity. 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 Patient is aware of this and this is the reason he came in today.   He also complains of sleep issues, states he wakes up and has trouble catching his breath sometimes. He has been told by his girlfriend and he snores loudly and stops breathing in his sleep. He also reports occasional daytime headaches. States these are typically related to him missing a meal and are quickly relieved with Excedrin. He does report an aura with his headaches and describes this as left eye becoming blurry and then he gets a headache shortly after that.  He had elevated serum creatinine and low potassium on blood work from May 2016. States he has not had labs repeated since then.  Denies fever, chills, fatigue, unexplained weight loss, chest pain, palpitations, DOE, GI or GU issues.   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 He states they saw a spot on his left leg then but thinks that it cleared up. Has history of Right MCL tear, had surgery in 20014 to  repair. History of tubes in ears as child.   Denies smoking, drinking alcohol, drug use.   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  Single. Daughter who is almost 2 yrs.    Reviewed allergies, medications, past medical, surgical and social history.   Review of Systems Pertinent positives and negatives in the history of present illness.     Objective:   Physical Exam  Constitutional: He is oriented to person, place, and time. He appears well-developed and well-nourished. No distress.  HENT:  Mouth/Throat: Oropharynx is clear and moist.  Cardiovascular: Normal rate, regular rhythm, normal heart sounds and intact distal pulses.  Exam reveals no gallop and no friction rub.   No murmur heard. Pulmonary/Chest: Breath sounds normal.  Musculoskeletal:       Left knee: He exhibits no effusion. Tenderness found.       Legs: No erythema, edema or warmth. Tenderness noted over the tibial tuberosity, otherwise nontender, normal ROM, pain with anterior drawer to anterior without laxity. Negative McMurray's.   Neurological: He is alert and oriented to person, place, and time.  Skin: Skin is warm and dry. No pallor.  Psychiatric: He has a normal mood and affect. His behavior is normal. Judgment and thought content normal.  Vitals reviewed.  BP 114/80 mmHg  Pulse 64  Wt 197 lb 3.2 oz (89.449 kg)  Epworth sleepiness scale scored a 10     Assessment & Plan:  Bone lesion - Plan: CBC with Differential/Platelet, Comprehensive metabolic panel, MR Femur Left W Wo Contrast, CANCELED: MR Femur Left W Contrast  Encounter to establish care  Sleep disturbance  History of hypokalemia - Plan: Comprehensive metabolic panel  Elevated serum creatinine - Plan: CBC with Differential/Platelet, Comprehensive metabolic panel  Knee injury, left, subsequent encounter  Headache, unspecified headache type   Reviewed x-ray of left knee from his visit to the emergency department earlier  today. No acute abnormality shown. Incidentally, a bone lesion was seen on his femur with recommendation by the radiologist for an outpatient MRI to determine if this is benign or something else. Sabrina, CMA, is obtaining approval for MRI of his left femur per radiologist's recommendation. Discussed RICE method for his left knee pain and he will let me know if this is not improving in the next 2-3 days. Also, I recommend 800 mg of ibuprofen 3 times daily with food for the next week to see if this will help with his symptoms.  Recommend a home sleep study due to his snoring and possible apneic episodes. He also scored a 10 on the Epworth sleepiness scale which indicates a possible sleep disorder. Referral will be made for the home sleep study.  Discussed that I would like to get his medical records sent to this office so that I can see when he is due for a physical and fasting blood work. Recommend that he avoid skipping meals since this is a trigger for his headaches. He will let me know if Excedrin no longer works for his headache or if he starts having them more frequently or if they become more bothersome to his daily activities. He will have medical records sent from [facility] to our office. Discussed that he will need a physical and fasting labs at some point depending on his last exam. We'll follow up pending MRI result of left femur and lab results. Spent a minimum of 45 minutes with patient face-to-face and 50% of that was in counseling and coordination of care.

## 2015-09-04 NOTE — ED Notes (Signed)
[employer] called to fit knee sleeve and crutches.

## 2015-09-04 NOTE — Discharge Instructions (Signed)
Knee Pain Follow up with your primary care provider.  Knee pain is a very common symptom and can have many causes. Knee pain often goes away when you follow your health care provider's instructions for relieving pain and discomfort at home. However, knee pain can develop into a condition that needs treatment. Some conditions may include:  Arthritis caused by wear and tear (osteoarthritis).  Arthritis caused by swelling and irritation (rheumatoid arthritis or gout).  A cyst or growth in your knee.  An infection in your knee joint.  An injury that will not heal.  Damage, swelling, or irritation of the tissues that support your knee (torn ligaments or tendinitis). If your knee pain continues, additional tests may be ordered to diagnose your condition. Tests may include X-rays or other imaging studies of your knee. You may also need to have fluid removed from your knee. Treatment for ongoing knee pain depends on the cause, but treatment may include:  Medicines to relieve pain or swelling.  Steroid injections in your knee.  Physical therapy.  Surgery. HOME CARE INSTRUCTIONS  Take medicines only as directed by your health care provider.  Rest your knee and keep it raised (elevated) while you are resting.  Do not do things that cause or worsen pain.  Avoid high-impact activities or exercises, such as running, jumping rope, or doing jumping jacks.  Apply ice to the knee area:  Put ice in a plastic bag.  Place a towel between your skin and the bag.  Leave the ice on for 20 minutes, 2-3 times a day.  Ask your health care provider if you should wear an elastic knee support.  Keep a pillow under your knee when you sleep.  Lose weight if you are overweight. Extra weight can put pressure on your knee.  Do not use any tobacco products, including cigarettes, chewing tobacco, or electronic cigarettes. If you need help quitting, ask your health care provider. Smoking may slow the  healing of any bone and joint problems that you may have. SEEK MEDICAL CARE IF:  Your knee pain continues, changes, or gets worse.  You have a fever along with knee pain.  Your knee buckles or locks up.  Your knee becomes more swollen. SEEK IMMEDIATE MEDICAL CARE IF:   Your knee joint feels hot to the touch.  You have chest pain or trouble breathing.   This information is not intended to replace advice given to you by your health care provider. Make sure you discuss any questions you have with your health care provider.   Document Released: 05/08/2007 Document Revised: 08/01/2014 Document Reviewed: 02/24/2014 Elsevier Interactive Patient Education [employer].

## 2015-09-07 NOTE — Addendum Note (Signed)
Addended by: Minette Headland A on: 09/07/2015 03:09 PM   Modules accepted: Orders

## 2015-09-12 ENCOUNTER — Inpatient Hospital Stay: Admission: RE | Admit: 2015-09-12 | Source: Ambulatory Visit

## 2015-09-14 ENCOUNTER — Ambulatory Visit
Admission: RE | Admit: 2015-09-14 | Discharge: 2015-09-14 | Disposition: A | Discharge status: Home / Self Care | Source: Ambulatory Visit | Attending: Family Medicine | Admitting: Family Medicine

## 2015-09-14 DIAGNOSIS — M899 Disorder of bone, unspecified: Secondary | ICD-10-CM

## 2015-09-14 MED ORDER — GADOBENATE DIMEGLUMINE 529 MG/ML IV SOLN
18.0000 mL | Freq: Once | INTRAVENOUS | Status: AC | PRN
  Administered 2015-09-14: 18 mL via INTRAVENOUS

## 2015-09-15 ENCOUNTER — Encounter: Payer: Self-pay | Admitting: Family Medicine

## 2015-10-06 ENCOUNTER — Encounter: Payer: Self-pay | Admitting: Family Medicine

## 2015-10-06 ENCOUNTER — Ambulatory Visit (INDEPENDENT_AMBULATORY_CARE_PROVIDER_SITE_OTHER): Admitting: Family Medicine

## 2015-10-06 ENCOUNTER — Other Ambulatory Visit: Payer: Self-pay | Admitting: Family Medicine

## 2015-10-06 VITALS — BP 120/72 | HR 64 | Ht 66.0 in | Wt 203.0 lb

## 2015-10-06 DIAGNOSIS — D473 Essential (hemorrhagic) thrombocythemia: Secondary | ICD-10-CM | POA: Diagnosis not present

## 2015-10-06 DIAGNOSIS — Z0000 Encounter for general adult medical examination without abnormal findings: Secondary | ICD-10-CM

## 2015-10-06 DIAGNOSIS — E669 Obesity, unspecified: Secondary | ICD-10-CM | POA: Diagnosis not present

## 2015-10-06 DIAGNOSIS — R7989 Other specified abnormal findings of blood chemistry: Secondary | ICD-10-CM | POA: Insufficient documentation

## 2015-10-06 DIAGNOSIS — R748 Abnormal levels of other serum enzymes: Secondary | ICD-10-CM

## 2015-10-06 DIAGNOSIS — G479 Sleep disorder, unspecified: Secondary | ICD-10-CM | POA: Insufficient documentation

## 2015-10-06 LAB — POCT URINALYSIS DIPSTICK
BILIRUBIN UA: NEGATIVE
GLUCOSE UA: NEGATIVE
KETONES UA: NEGATIVE
LEUKOCYTES UA: NEGATIVE
NITRITE UA: NEGATIVE
PH UA: 6
Protein, UA: NEGATIVE
RBC UA: NEGATIVE
Spec Grav, UA: >=1.03
Urobilinogen, UA: NEGATIVE

## 2015-10-06 LAB — CBC WITH DIFFERENTIAL/PLATELET
BASOS ABS: 0.1 10*3/uL (ref 0.0–0.1)
BASOS PCT: 1 % (ref 0–1)
EOS ABS: 0.4 10*3/uL (ref 0.0–0.7)
Eosinophils Relative: 7 % — ABNORMAL HIGH (ref 0–5)
HCT: 44.1 % (ref 39.0–52.0)
HEMOGLOBIN: 14.9 g/dL (ref 13.0–17.0)
LYMPHS ABS: 1.8 10*3/uL (ref 0.7–4.0)
Lymphocytes Relative: 28 % (ref 12–46)
MCH: 29.4 pg (ref 26.0–34.0)
MCHC: 33.8 g/dL (ref 30.0–36.0)
MCV: 87.2 fL (ref 78.0–100.0)
MPV: 10.3 fL (ref 8.6–12.4)
Monocytes Absolute: 0.5 10*3/uL (ref 0.1–1.0)
Monocytes Relative: 8 % (ref 3–12)
NEUTROS ABS: 3.6 10*3/uL (ref 1.7–7.7)
NEUTROS PCT: 56 % (ref 43–77)
PLATELETS: 569 10*3/uL — AB (ref 150–400)
RBC: 5.06 MIL/uL (ref 4.22–5.81)
RDW: 13.8 % (ref 11.5–15.5)
WBC: 6.4 10*3/uL (ref 4.0–10.5)

## 2015-10-06 LAB — LIPID PANEL
CHOL/HDL RATIO: 3.5 ratio (ref <=5.0)
Cholesterol: 165 mg/dL (ref 125–200)
HDL: 47 mg/dL (ref >=40)
LDL Cholesterol: 106 mg/dL (ref <130)
Triglycerides: 59 mg/dL (ref <150)
VLDL: 12 mg/dL (ref <30)

## 2015-10-06 LAB — COMPREHENSIVE METABOLIC PANEL
ALK PHOS: 91 U/L (ref 40–115)
ALT: 48 U/L — AB (ref 9–46)
AST: 25 U/L (ref 10–40)
Albumin: 4.6 g/dL (ref 3.6–5.1)
BILIRUBIN TOTAL: 0.5 mg/dL (ref 0.2–1.2)
BUN: 7 mg/dL (ref 7–25)
CO2: 28 mmol/L (ref 20–31)
CREATININE: 1.22 mg/dL (ref 0.60–1.35)
Calcium: 9.7 mg/dL (ref 8.6–10.3)
Chloride: 102 mmol/L (ref 98–110)
Glucose, Bld: 85 mg/dL (ref 65–99)
Potassium: 4.7 mmol/L (ref 3.5–5.3)
SODIUM: 141 mmol/L (ref 135–146)
TOTAL PROTEIN: 7.4 g/dL (ref 6.1–8.1)

## 2015-10-06 NOTE — Patient Instructions (Signed)
Preventative Care for Adults, Male       REGULAR HEALTH EXAMS:  A routine yearly physical is a good way to check in with your primary care provider about your health and preventive screening. It is also an opportunity to share updates about your health and any concerns you have, and receive a thorough all-over exam.   Most health insurance companies pay for at least some preventative services.  Check with your health plan for specific coverages.  WHAT PREVENTATIVE SERVICES DO MEN NEED?  Adult men should have their weight and blood pressure checked regularly.   Men age 30 and older should have their cholesterol levels checked regularly.  Beginning at age 30 and continuing to age 30, men should be screened for colorectal cancer.  Certain people should may need continued testing until age 85.  Other cancer screening may include exams for testicular and prostate cancer.  Updating vaccinations is part of preventative care.  Vaccinations help protect against diseases such as the flu.  Lab tests are generally done as part of preventative care to screen for anemia and blood disorders, to screen for problems with the kidneys and liver, to screen for bladder problems, to check blood sugar, and to check your cholesterol level.  Preventative services generally include counseling about diet, exercise, avoiding tobacco, drugs, excessive alcohol consumption, and sexually transmitted infections.    GENERAL RECOMMENDATIONS FOR GOOD HEALTH:  Healthy diet:  Eat a variety of foods, including fruit, vegetables, animal or vegetable protein, such as meat, fish, chicken, and eggs, or beans, lentils, tofu, and grains, such as rice.  Drink plenty of water daily.  Decrease saturated fat in the diet, avoid lots of red meat, processed foods, sweets, fast foods, and fried foods.  Exercise:  Aerobic exercise helps maintain good heart health. At least 30-40 minutes of moderate-intensity exercise is recommended.  For example, a brisk walk that increases your heart rate and breathing. This should be done on most days of the week.   Find a type of exercise or a variety of exercises that you enjoy so that it becomes a part of your daily life.  Examples are running, walking, swimming, water aerobics, and biking.  For motivation and support, explore group exercise such as aerobic class, spin class, Zumba, Yoga,or  martial arts, etc.    Set exercise goals for yourself, such as a certain weight goal, walk or run in a race such as a 5k walk/run.  Speak to your primary care provider about exercise goals.  Disease prevention:  If you smoke or chew tobacco, find out from your caregiver how to quit. It can literally save your life, no matter how long you have been a tobacco user. If you do not use tobacco, never begin.   Maintain a healthy diet and normal weight. Increased weight leads to problems with blood pressure and diabetes.   The Body Mass Index or BMI is a way of measuring how much of your body is fat. Having a BMI above 27 increases the risk of heart disease, diabetes, hypertension, stroke and other problems related to obesity. Your caregiver can help determine your BMI and based on it develop an exercise and dietary program to help you achieve or maintain this important measurement at a healthful level.  High blood pressure causes heart and blood vessel problems.  Persistent high blood pressure should be treated with medicine if weight loss and exercise do not work.   Fat and cholesterol leaves deposits in your arteries   that can block them. This causes heart disease and vessel disease elsewhere in your body.  If your cholesterol is found to be high, or if you have heart disease or certain other medical conditions, then you may need to have your cholesterol monitored frequently and be treated with medication.   Ask if you should have a stress test if your history suggests this. A stress test is a test done on  a treadmill that looks for heart disease. This test can find disease prior to there being a problem.  Avoid drinking alcohol in excess (more than two drinks per day).  Avoid use of street drugs. Do not share needles with anyone. Ask for professional help if you need assistance or instructions on stopping the use of alcohol, cigarettes, and/or drugs.  Brush your teeth twice a day with fluoride toothpaste, and floss once a day. Good oral hygiene prevents tooth decay and gum disease. The problems can be painful, unattractive, and can cause other health problems. Visit your dentist for a routine oral and dental check up and preventive care every 6-12 months.   Look at your skin regularly.  Use a mirror to look at your back. Notify your caregivers of changes in moles, especially if there are changes in shapes, colors, a size larger than a pencil eraser, an irregular border, or development of new moles.  Safety:  Use seatbelts 100% of the time, whether driving or as a passenger.  Use safety devices such as hearing protection if you work in environments with loud noise or significant background noise.  Use safety glasses when doing any work that could send debris in to the eyes.  Use a helmet if you ride a bike or motorcycle.  Use appropriate safety gear for contact sports.  Talk to your caregiver about gun safety.  Use sunscreen with a SPF (or skin protection factor) of 15 or greater.  Lighter skinned people are at a greater risk of skin cancer. Don't forget to also wear sunglasses in order to protect your eyes from too much damaging sunlight. Damaging sunlight can accelerate cataract formation.   Practice safe sex. Use condoms. Condoms are used for birth control and to help reduce the spread of sexually transmitted infections (or STIs).  Some of the STIs are gonorrhea (the clap), chlamydia, syphilis, trichomonas, herpes, HPV (human papilloma virus) and HIV (human immunodeficiency virus) which causes AIDS.  The herpes, HIV and HPV are viral illnesses that have no cure. These can result in disability, cancer and death.   Keep carbon monoxide and smoke detectors in your home functioning at all times. Change the batteries every 6 months or use a model that plugs into the wall.   Vaccinations:  Stay up to date with your tetanus shots and other required immunizations. You should have a booster for tetanus every 10 years. Be sure to get your flu shot every year, since 5%-20% of the [place] population comes down with the flu. The flu vaccine changes each year, so being vaccinated once is not enough. Get your shot in the fall, before the flu season peaks.   Other vaccines to consider:  Pneumococcal vaccine to protect against certain types of pneumonia.  This is normally recommended for adults age 65 or older.  However, adults younger than 30 years old with certain underlying conditions such as diabetes, heart or lung disease should also receive the vaccine.  Shingles vaccine to protect against Varicella Zoster if you are older than age 60, or younger   than 30 years old with certain underlying illness.  Hepatitis A vaccine to protect against a form of infection of the liver by a virus acquired from food.  Hepatitis B vaccine to protect against a form of infection of the liver by a virus acquired from blood or body fluids, particularly if you work in health care.  If you plan to travel internationally, check with your local health department for specific vaccination recommendations.  Cancer Screening:  Most routine colon cancer screening begins at the age of 50. On a yearly basis, doctors may provide special easy to use take-home tests to check for hidden blood in the stool. Sigmoidoscopy or colonoscopy can detect the earliest forms of colon cancer and is life saving. These tests use a small camera at the end of a tube to directly examine the colon. Speak to your caregiver about this at age 50, when routine  screening begins (and is repeated every 5 years unless early forms of pre-cancerous polyps or small growths are found).   At the age of 50 men usually start screening for prostate cancer every year. Screening may begin at a younger age for those with higher risk. Those at higher risk include African-Americans or having a family history of prostate cancer. There are two types of tests for prostate cancer:   Prostate-specific antigen (PSA) testing. Recent studies raise questions about prostate cancer using PSA and you should discuss this with your caregiver.   Digital rectal exam (in which your doctor's lubricated and gloved finger feels for enlargement of the prostate through the anus).   Screening for testicular cancer.  Do a monthly exam of your testicles. Gently roll each testicle between your thumb and fingers, feeling for any abnormal lumps. The best time to do this is after a hot shower or bath when the tissues are looser. Notify your caregivers of any lumps, tenderness or changes in size or shape immediately.     

## 2015-10-06 NOTE — Progress Notes (Signed)
Subjective:    Patient ID: Adam Prince, male    DOB: 1986/04/12, 30 y.o.   MRN: [id number]  HPI Chief Complaint  Patient presents with  . fasting cpe    fasting cpe,    He is here for complete physical exam. His last physical was last year with [employer] in [place]. 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  Elevated serum creatinine with recent blood work.  Elevated platelets with recent blood work.  Home sleep study to rule out sleep apnea- scheduled.  Gets occasional headaches, not bothering him, Ibuprofen takes care of this.   Concerns/complaints: none.   Other providers: eye [facility] [address]- 09/2015, dentist- annually  Past medical history:  Recently had a bone lesion that showed up on XR to left distal femur and MRI showed this was benign. Reports history of Histiocytosis X at age 59 and was going to [facility] for this.  Has history of Right MCL tear, had surgery in 20014 to repair.  History of tubes in ears as child.  Family history: mother with A- Fib, Migraines. Father- unknown. Brother asthma.   Immunizations: Flu- up to date. Tdap- up to date.   Denies smoking, drinking alcohol, drug use.   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  Single. Daughter who is almost 2 yrs.  States he had STD testing in 04/2015 and all negative including HIV, RPR.   Wear seatbelts, smoke detectors in home functioning, does not text while driving, no guns in home  Reviewed allergies, medications, past medical, surgical, family, social history.  Review of Systems Review of Systems Constitutional: -fever, -chills, -sweats, -unexpected weight change,-fatigue ENT: -runny nose, -ear pain, -sore throat Cardiology:  -chest pain, -palpitations, -edema Respiratory: -cough, -shortness of breath, -wheezing Gastroenterology: -abdominal pain, -nausea, -vomiting, -diarrhea, -constipation  Hematology: -bleeding or bruising problems Musculoskeletal:  -arthralgias, -myalgias, -joint swelling, -back pain Ophthalmology: -vision changes Urology: -dysuria, -difficulty urinating, -hematuria, -urinary frequency, -urgency Neurology: + occasional headache, -weakness, -tingling, -numbness       Objective:   Physical Exam BP 120/72 mmHg  Pulse 64  Ht 5\' 6"  (1.676 m)  Wt 203 lb (92.08 kg)  BMI 32.78 kg/m2  General Appearance:    Alert, cooperative, no distress, appears stated age  Head:    Normocephalic, without obvious abnormality, atraumatic  Eyes:    PERRL, conjunctiva/corneas clear, EOM's intact, fundi    benign  Ears:    Normal TM's and external ear canals  Nose:   Nares normal, mucosa normal, no drainage or sinus   tenderness  Throat:   Lips, mucosa, and tongue normal; teeth and gums normal  Neck:   Supple, no lymphadenopathy;  thyroid:  no   enlargement/tenderness/nodules; no carotid   bruit or JVD  Back:    Spine nontender, no curvature, ROM normal, no CVA     tenderness  Lungs:     Clear to auscultation bilaterally without wheezes, rales or     ronchi; respirations unlabored  Chest Wall:    No tenderness or deformity   Heart:    Regular rate and rhythm, S1 and S2 normal, no murmur, rub   or gallop  Breast Exam:    No chest wall tenderness, masses or gynecomastia  Abdomen:     Soft, non-tender, nondistended, normoactive bowel sounds,    no masses, no hepatosplenomegaly  Genitalia:    Deferred. Had this done in 04/2015  Rectal:  Deferred due to age <40 and lack of symptoms  Extremities:   No clubbing, cyanosis or edema  Pulses:   2+ and symmetric all extremities  Skin:   Skin color, texture, turgor normal, no rashes or lesions  Lymph nodes:   Cervical, supraclavicular, and axillary nodes normal  Neurologic:   CNII-XII intact, normal strength, sensation and gait; reflexes 2+ and symmetric throughout          Psych:   Normal mood, affect, hygiene and grooming.     Urinalysis dipstick: normal, spec grav >1.030     Assessment  & Plan:  Routine general medical examination at a health care facility - Plan: CBC with Differential/Platelet, Comprehensive metabolic panel, Lipid panel, TSH  Elevated serum creatinine - Plan: POCT urinalysis dipstick, Comprehensive metabolic panel  High platelet count (HCC) - Plan: CBC with Differential/Platelet, Pathologist smear review  Sleep disturbance - Plan: TSH  Obesity - Plan: TSH  Recommend that he get 150 minutes of physical activity per week and watch his calorie intake and portion control. Recommend eating healthy foods and limiting fried and fast food. He has a sleep study scheduled in April for possible sleep apnea and I recommend he keep this appointment. Epworth sleep scale was borderline at his last appointment and he does exhibit sleep apnea symptoms such as snoring and periods of apnea per girlfriend.  Previous lab results revealed elevated platelet count. Discussed that we will investigate this further, blood smear ordered and will consider referral to hematologist pending lab results. Recheck serum creatinine level, this may have been due to not being well hydrated.  Discussed safety and health promotion. Follow up pending labs.

## 2015-10-07 LAB — PATHOLOGIST SMEAR REVIEW

## 2015-10-07 LAB — TSH: TSH: 1.67 mIU/L (ref 0.40–4.50)

## 2015-10-09 ENCOUNTER — Telehealth: Payer: Self-pay | Admitting: Internal Medicine

## 2015-10-09 DIAGNOSIS — D75839 Thrombocytosis, unspecified: Secondary | ICD-10-CM

## 2015-10-09 DIAGNOSIS — D473 Essential (hemorrhagic) thrombocythemia: Secondary | ICD-10-CM

## 2015-10-09 NOTE — Telephone Encounter (Signed)
-----   Message from Girtha Rm, NP sent at 10/09/2015  1:19 PM EDT ----- Please refer him to hematologist for thrombocytosis.

## 2015-10-10 LAB — HEPATITIS PANEL, ACUTE
HCV AB: NEGATIVE
HEP A IGM: NONREACTIVE
HEP B C IGM: NONREACTIVE
Hepatitis B Surface Ag: NEGATIVE

## 2015-10-22 ENCOUNTER — Encounter: Payer: Self-pay | Admitting: Internal Medicine

## 2015-10-22 ENCOUNTER — Telehealth: Payer: Self-pay | Admitting: Oncology

## 2015-10-22 ENCOUNTER — Encounter: Payer: Self-pay | Admitting: Oncology

## 2015-10-22 NOTE — Telephone Encounter (Signed)
Verified address and insurance, referring office will contact pt with appt., mailed new pt packet, scheduled intake

## 2015-10-27 ENCOUNTER — Telehealth: Payer: Self-pay | Admitting: Oncology

## 2015-10-27 NOTE — Telephone Encounter (Signed)
pt called to r/s new pt appt....pt ok and aware of new d.t

## 2015-11-03 ENCOUNTER — Ambulatory Visit: Admitting: Oncology

## 2015-11-12 ENCOUNTER — Telehealth: Payer: Self-pay | Admitting: Family Medicine

## 2015-11-12 NOTE — Telephone Encounter (Signed)
Records received from [facility]. Sending back for review. Please note what needs to be scanned, abstracted or filed in a paper chart.

## 2015-11-19 ENCOUNTER — Ambulatory Visit (HOSPITAL_BASED_OUTPATIENT_CLINIC_OR_DEPARTMENT_OTHER): Attending: Family Medicine

## 2015-12-01 ENCOUNTER — Ambulatory Visit: Admitting: Oncology

## 2015-12-25 ENCOUNTER — Ambulatory Visit: Admitting: Family Medicine

## 2016-02-14 IMAGING — CR DG KNEE COMPLETE 4+V*L*
4 series · 4 of 4 positions shown · non-contrast
Comparison: No prior .

CLINICAL DATA: Fall.  Initial evaluation.

EXAM:
LEFT KNEE - COMPLETE 4+ VIEW

[w knee ap left (1 of 2)]
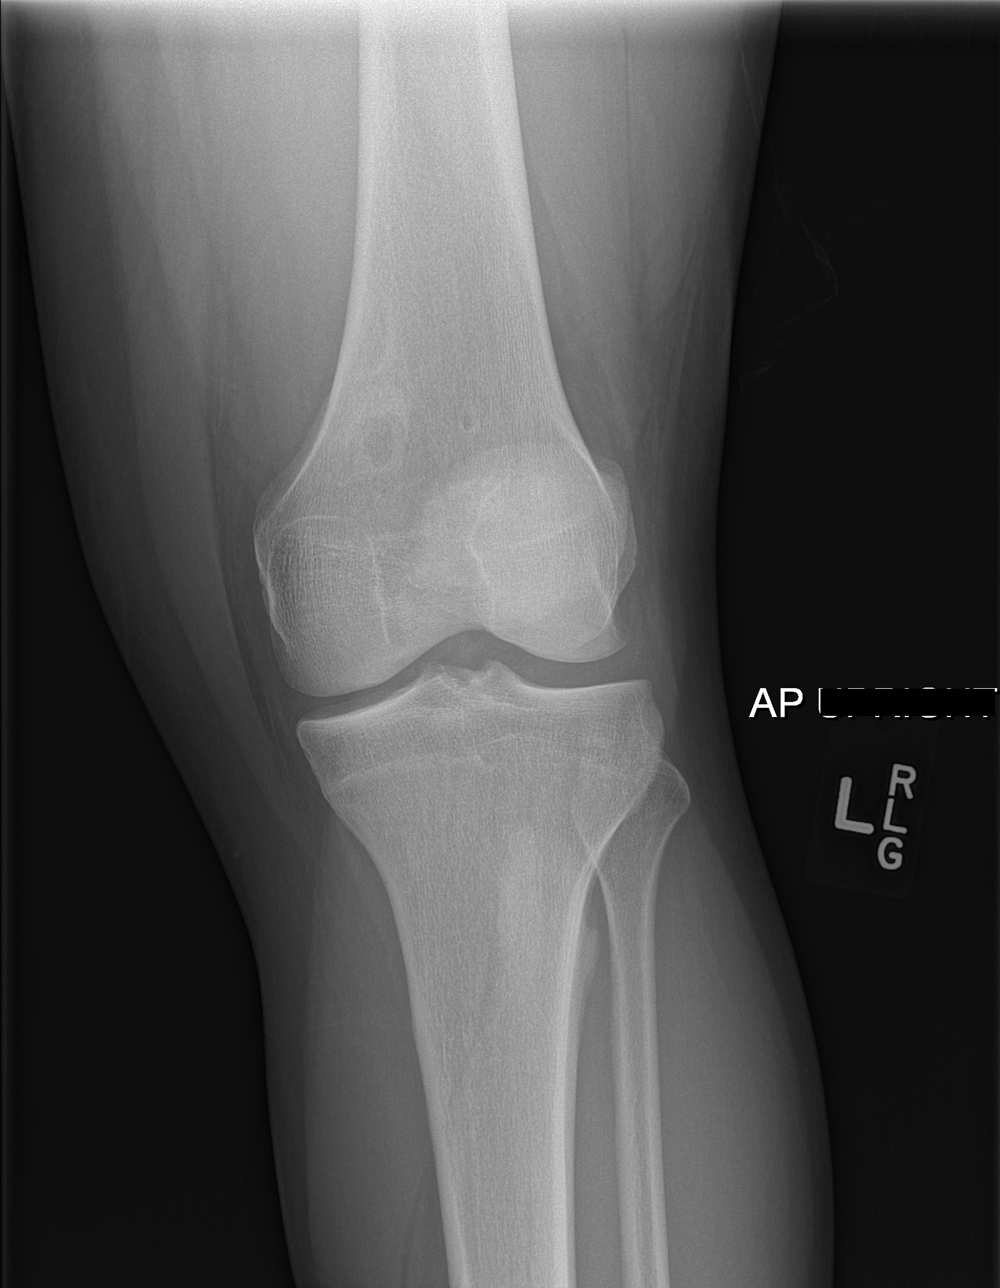

[w knee ap left (2 of 2)]
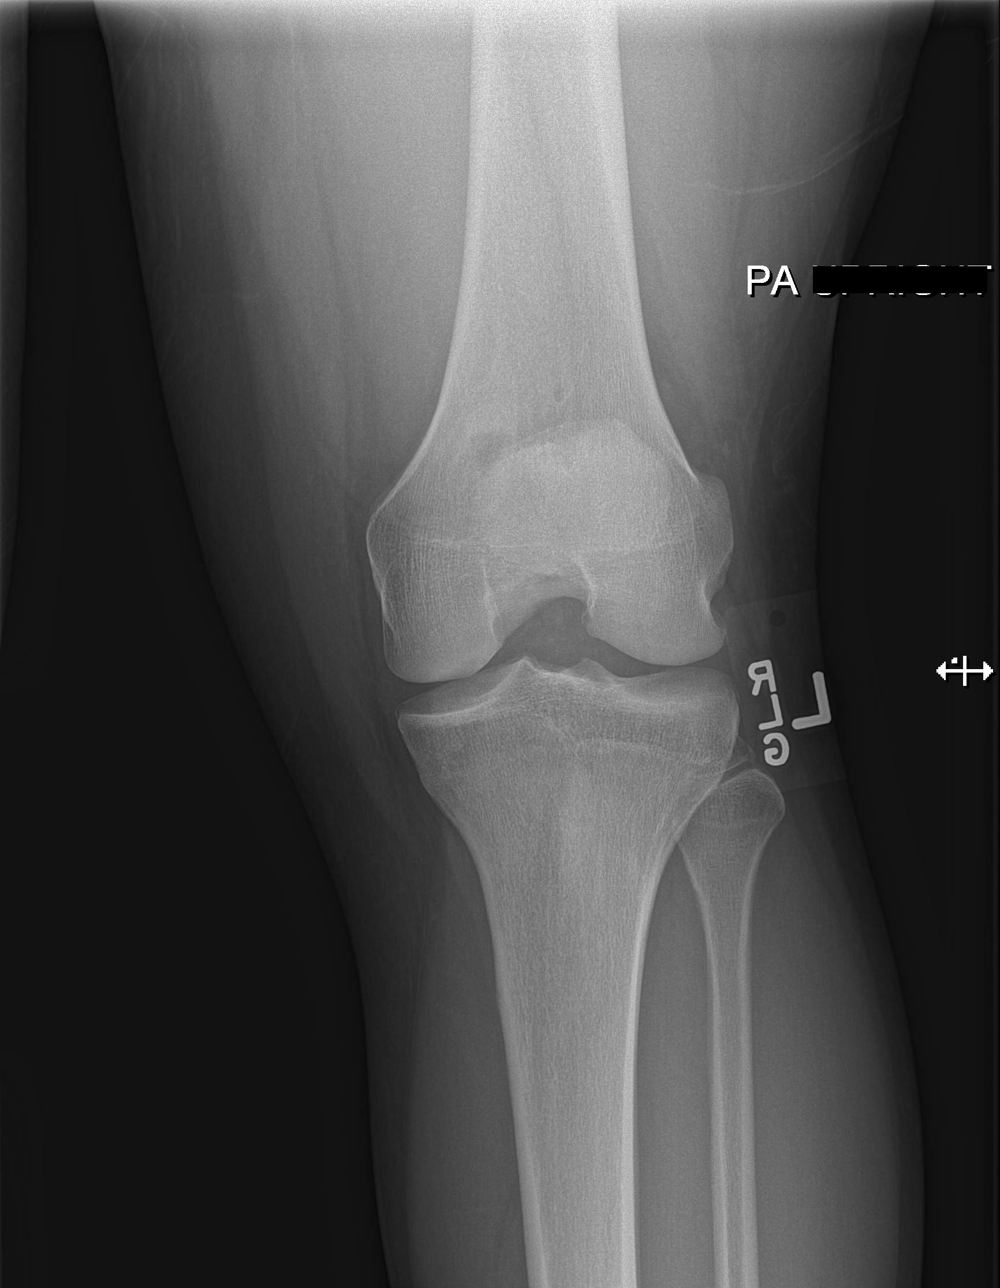

[t knee lat left]
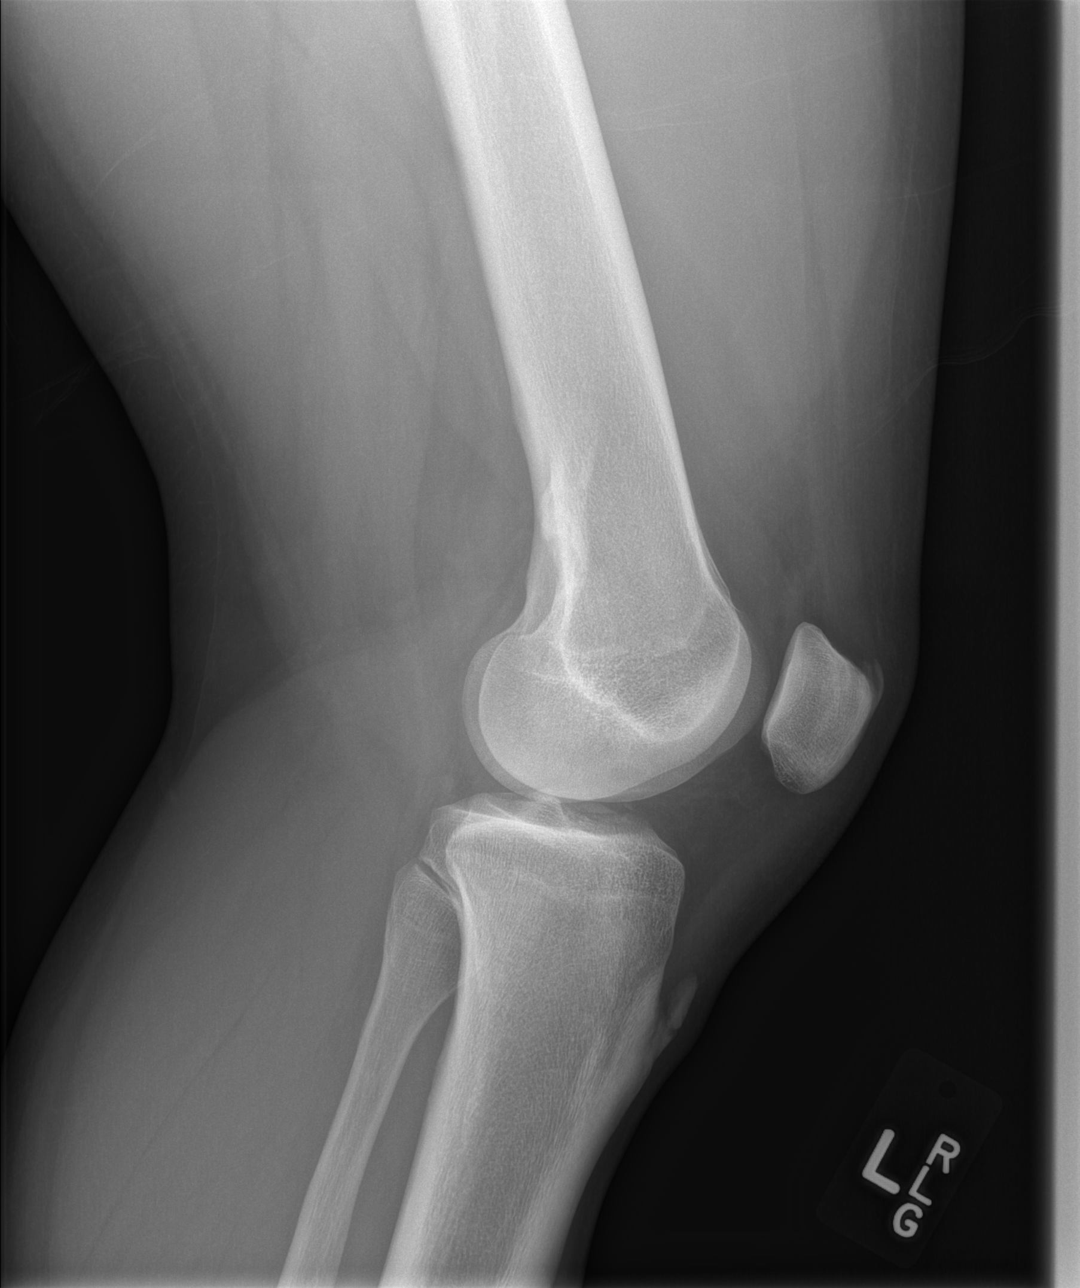

[x knee ap left]
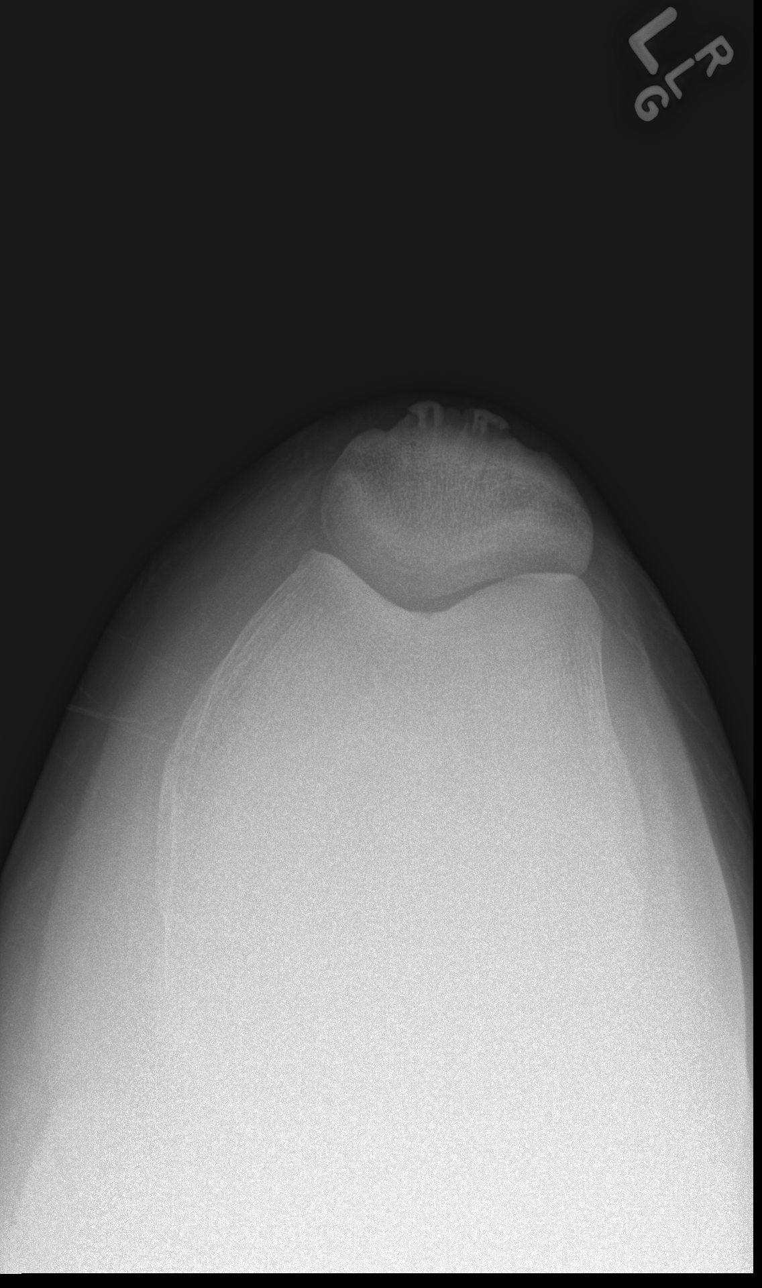

[4 of 4 positions shown; findings below may reference images not displayed]

FINDINGS: No acute bony or joint abnormality identified. Ill-defined lucency
with faintly sclerotic margins noted along the posterior medial
aspect of the distal left femur. This is most likely benign
fibro-osseous lesion, however gadolinium-enhanced MRI of the left
knee is suggested for further evaluation given its slightly
irregular appearance.
IMPRESSION: 1.  No acute abnormality.

2. Ill-defined lucency with faintly sclerotic margins noted along
posterior medial aspect of the distal femur. This most likely a
benign fibro-osseous lesion however further evaluation with
gadolinium-enhanced MRI suggested given its slightly irregular
appearance .

## 2016-05-26 ENCOUNTER — Encounter (HOSPITAL_COMMUNITY): Payer: Self-pay | Admitting: Emergency Medicine

## 2016-05-26 ENCOUNTER — Emergency Department (HOSPITAL_COMMUNITY)
Admission: EM | Admit: 2016-05-26 | Discharge: 2016-05-26 | Disposition: A | Discharge status: Home / Self Care | Attending: Emergency Medicine | Admitting: Emergency Medicine

## 2016-05-26 DIAGNOSIS — I808 Phlebitis and thrombophlebitis of other sites: Secondary | ICD-10-CM | POA: Insufficient documentation

## 2016-05-26 DIAGNOSIS — Z79899 Other long term (current) drug therapy: Secondary | ICD-10-CM | POA: Insufficient documentation

## 2016-05-26 MED ORDER — CEPHALEXIN 500 MG PO CAPS: 500.0000 mg | ORAL_CAPSULE | Freq: Four times a day (QID) | ORAL | 0 refills | Status: DC

## 2016-05-26 MED ORDER — IBUPROFEN 600 MG PO TABS: 600.0000 mg | ORAL_TABLET | Freq: Four times a day (QID) | ORAL | 0 refills | Status: DC | PRN

## 2016-05-26 NOTE — ED Triage Notes (Signed)
Pt gave plasma two days ago and is c/o pain and swelling in L upper arm, above where IV was placed at the time. Pt was seen at [facility] yesterday for the same and was given an ultrasound that was negative for blood clots. Pt sts he was given a work note for one day off of work but that he needs more than that because his arm is still sore. [facility] told pt the IV probably infiltrated and he may have pain, bruising, and swelling for a few days.

## 2016-05-26 NOTE — ED Notes (Signed)
Patient here for a work note. Had an infiltration and went to [facility] to get it ultrasound to make sure there was not clot and said he needs more than one day off work.

## 2016-05-26 NOTE — Discharge Instructions (Signed)
Warm compresses to the arm. Ibuprofen for pain. Keflex for infection. Follow up if not improving. Return for recheck if worsening.

## 2016-05-26 NOTE — ED Notes (Signed)
Bed: WA02 Expected date:  Expected time:  Means of arrival:  Comments: Low K from OR

## 2016-05-26 NOTE — ED Provider Notes (Signed)
[facility] DEPT Provider Note   CSN: [id number] Arrival date & time: 05/26/16  0746     History   Chief Complaint Chief Complaint  Patient presents with  . Arm Pain    HPI Adam Prince is a 30 y.o. male.  HPI Adam Prince is a 30 y.o. male presents to emergency department complaining of left upper arm pain and swelling. Patient states that she gave plasma 2 days ago. He states he felt some pain at the very end of the transfusion. He states yesterday he developed pain in the left upper arm. He went to [facility] ED, there had ultrasound performed which showed no blood clots. He was discharged home with symptomatic treatment. He states last night the area on all problems became red, warm to the touch, swollen. He came back for reevaluation. Denies fever or chills. Denies numbness or weakness distal to the tender area. Denies any chest pain or shortness of breath. No history of the same. No other complaints.  Past Medical History:  Diagnosis Date  . Achilles tendonitis 2016   right leg  . 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 Sleep disturbance 10/06/2015  . 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 Elevated serum creatinine 10/06/2015    Past Surgical History:  Procedure Laterality Date  . KNEE SURGERY    . TYMPANOSTOMY TUBE PLACEMENT         Home Medications    Prior to Admission medications   Medication Sig Start Date End Date Taking? Authorizing Provider  ibuprofen (ADVIL,MOTRIN) 200 MG tablet Take 400 mg by mouth every 6 (six) hours as needed for headache, mild pain or moderate pain. Reported on 10/06/2015    Historical Provider, MD    Family History Family History  Problem Relation Age of Onset  . Atrial fibrillation Mother   . Asthma Brother     Social History Social History  Substance Use Topics  . Smoking status: Never Smoker  . Smokeless tobacco:  Never Used  . Alcohol use Yes     Comment: social     Allergies   Sulfa antibiotics   Review of Systems Review of Systems  Constitutional: Negative for chills and fever.  Respiratory: Negative for cough, chest tightness and shortness of breath.   Cardiovascular: Negative for chest pain, palpitations and leg swelling.  Gastrointestinal: Negative for abdominal distention, abdominal pain, diarrhea, nausea and vomiting.  Musculoskeletal: Positive for arthralgias and myalgias. Negative for neck pain and neck stiffness.  Skin: Positive for color change. Negative for rash.  Allergic/Immunologic: Negative for immunocompromised state.  Neurological: Negative for dizziness, weakness, light-headedness, numbness and headaches.  All other systems reviewed and are negative.    Physical Exam Updated Vital Signs BP 123/77 (BP Location: Right Arm)   Pulse 66   Temp 98.2 F (36.8 C) (Oral)   Resp 16   SpO2 100%   Physical Exam  Constitutional: He appears well-developed and well-nourished. No distress.  HENT:  Head: Normocephalic and atraumatic.  Eyes: Conjunctivae are normal.  Neck: Neck supple.  Cardiovascular: Normal rate, regular rhythm and normal heart sounds.   Pulmonary/Chest: Effort normal. No respiratory distress. He has no wheezes. He has no rales.  Musculoskeletal: He exhibits no edema.       Arms: Pinpoint mark to the left AC from recent IV site. No erythema, swelling and induration around this area. There  is significant swelling, erythema, and mild bruising to the anterior distal left upper arm. It is tender to palpation. No palpable definite abscess palpated. Distal radial pulses intact. Bicep strength intact.   Neurological: He is alert.  Skin: Skin is warm and dry.  Nursing note and vitals reviewed.    ED Treatments / Results  Labs (all labs ordered are listed, but only abnormal results are displayed) Labs Reviewed - No data to display  EKG  EKG  Interpretation None       Radiology No results found.  Procedures Procedures (including critical care time)  Medications Ordered in ED Medications - No data to display   Initial Impression / Assessment and Plan / ED Course  I have reviewed the triage vital signs and the nursing notes.  Pertinent labs & imaging results that were available during my care of the patient were reviewed by me and considered in my medical decision making (see chart for details).  Clinical Course    Patient with erythema, pain, swelling to the left upper arm after donating plasma 2 days ago. He is afebrile, nontoxic appearing. Had negative venous Doppler of the left arm yesterday. Most likely superficial thrombophlebitis. Concerning for possible infection given erythema and warmth. Will cover with antibiotics, warm compresses, NSAIDs, follow up as needed. Return precautions discussed.  Final Clinical Impressions(s) / ED Diagnoses   Final diagnoses:  Superficial thrombophlebitis of left upper extremity    New Prescriptions New Prescriptions   CEPHALEXIN (KEFLEX) 500 MG CAPSULE    Take 1 capsule (500 mg total) by mouth 4 (four) times daily.   IBUPROFEN (ADVIL,MOTRIN) 600 MG TABLET    Take 1 tablet (600 mg total) by mouth every 6 (six) hours as needed.     Jeannett Senior, PA-C 05/26/16 [id number]    Davonna Belling, MD 05/26/16 [phone]

## 2016-07-17 ENCOUNTER — Encounter (HOSPITAL_COMMUNITY): Payer: Self-pay | Admitting: *Deleted

## 2016-07-17 ENCOUNTER — Emergency Department (HOSPITAL_COMMUNITY): Payer: Self-pay

## 2016-07-17 ENCOUNTER — Emergency Department (HOSPITAL_COMMUNITY)
Admission: EM | Admit: 2016-07-17 | Discharge: 2016-07-17 | Disposition: A | Discharge status: Home / Self Care | Payer: Self-pay | Attending: Emergency Medicine | Admitting: Emergency Medicine

## 2016-07-17 DIAGNOSIS — J111 Influenza due to unidentified influenza virus with other respiratory manifestations: Secondary | ICD-10-CM | POA: Insufficient documentation

## 2016-07-17 DIAGNOSIS — J90 Pleural effusion, not elsewhere classified: Secondary | ICD-10-CM

## 2016-07-17 DIAGNOSIS — R0781 Pleurodynia: Secondary | ICD-10-CM

## 2016-07-17 DIAGNOSIS — J918 Pleural effusion in other conditions classified elsewhere: Secondary | ICD-10-CM | POA: Insufficient documentation

## 2016-07-17 MED ORDER — IBUPROFEN 600 MG PO TABS: 600.0000 mg | ORAL_TABLET | Freq: Four times a day (QID) | ORAL | 0 refills | Status: DC | PRN

## 2016-07-17 NOTE — ED Provider Notes (Signed)
[facility] DEPT Provider Note   CSN: [id number] Arrival date & time: 07/17/16  0034     History   Chief Complaint Chief Complaint  Patient presents with  . Chest Pain  . Cough    HPI Adam Prince is a 30 y.o. male.  The patient presents for evaluation of left chest wall pain x 2 days. He has a dry cough but no fever, vomiting, congestion. He feels the pain with cough and with deep breath only. No pain with movement. He denies abdominal pain or chest injury. No PE risk factors. Patient is a non-smoker.    The history is provided by the patient. No language interpreter was used.  Chest Pain   Associated symptoms include cough. Pertinent negatives include no abdominal pain, no back pain, no fever, no nausea, no vomiting and no weakness.  Cough  Associated symptoms include chest pain. Pertinent negatives include no sore throat.    Past Medical History:  Diagnosis Date  . Achilles tendonitis 2016   right leg  . 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 Sleep disturbance 10/06/2015  . 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 Elevated serum creatinine 10/06/2015    Past Surgical History:  Procedure Laterality Date  . KNEE SURGERY    . TYMPANOSTOMY TUBE PLACEMENT         Home Medications    Prior to Admission medications   Medication Sig Start Date End Date Taking? Authorizing Provider  ibuprofen (ADVIL,MOTRIN) 200 MG tablet Take 400 mg by mouth every 6 (six) hours as needed for headache, mild pain or moderate pain. Reported on 10/06/2015   Yes Historical Provider, MD    Family History Family History  Problem Relation Age of Onset  . Atrial fibrillation Mother   . Asthma Brother     Social History Social History  Substance Use Topics  . Smoking status: Never Smoker  . Smokeless tobacco: Never Used  . Alcohol use Yes     Comment: social     Allergies     Sulfa antibiotics   Review of Systems Review of Systems  Constitutional: Negative for fever.  HENT: Negative.  Negative for congestion and sore throat.   Respiratory: Positive for cough.   Cardiovascular: Positive for chest pain.  Gastrointestinal: Negative for abdominal pain, nausea and vomiting.  Musculoskeletal: Negative for back pain.  Neurological: Negative for weakness and light-headedness.     Physical Exam Updated Vital Signs BP 114/78 (BP Location: Left Arm)   Pulse 66   Temp 98.6 F (37 C) (Oral)   Resp 18   SpO2 97%   Physical Exam  Constitutional: He appears well-developed and well-nourished.  HENT:  Head: Normocephalic.  Neck: Normal range of motion. Neck supple.  Cardiovascular: Normal rate and regular rhythm.   No murmur heard. Pulmonary/Chest: Effort normal and breath sounds normal. He has no wheezes. He has no rales. He exhibits no tenderness.  Abdominal: Soft. Bowel sounds are normal. There is no tenderness. There is no rebound and no guarding.  Musculoskeletal: Normal range of motion.  Neurological: He is alert.  Skin: Skin is warm and dry. No rash noted.  Psychiatric: He has a normal mood and affect.     ED Treatments / Results  Labs (all labs ordered are listed, but only abnormal results are displayed) Labs Reviewed - No data to display  EKG  EKG Interpretation None       Radiology Dg Chest 2 View  Result Date: 07/17/2016 CLINICAL DATA:  Left-sided chest pain and dyspnea for 3 days. EXAM: CHEST  2 VIEW COMPARISON:  12/15/2014 FINDINGS: Blunting of the left lateral costophrenic angle, possibly a small effusion although this is not confirmed on the lateral view. Mild linear left base opacities. Right lung is clear. Pulmonary vasculature is normal. Hilar and mediastinal contours are unremarkable and unchanged. Heart size is normal. IMPRESSION: Possible small left pleural effusion. No consolidation. Normal vasculature. Electronically Signed    By: Andreas Newport M.D.   On: 07/17/2016 02:13    Procedures Procedures (including critical care time)  Medications Ordered in ED Medications - No data to display   Initial Impression / Assessment and Plan / ED Course  I have reviewed the triage vital signs and the nursing notes.  Pertinent labs & imaging results that were available during my care of the patient were reviewed by me and considered in my medical decision making (see chart for details).  Clinical Course     Patient with focal left lower chest wall pain with deep breath and cough. No fever. No PE risk factors. CXR showing small left pleural effusion. Patient is overall well appearing, no hypoxia. He can be discharged home with supportive care.   Final Clinical Impressions(s) / ED Diagnoses   Final diagnoses:  None   1. Left chest pain 2. Left pleural effusion  New Prescriptions New Prescriptions   No medications on file     Charlann Lange, Hershal Cor[place] [phone]    Delora Fuel, MD [id number]

## 2016-07-17 NOTE — ED Triage Notes (Signed)
The pt is c/o chest pain especially when he moves or coughs.  Non-productive cough  No pain unless he coughs

## 2016-11-18 ENCOUNTER — Encounter: Payer: Self-pay | Admitting: Family Medicine

## 2016-12-27 IMAGING — CR DG CHEST 2V
2 series · 2 of 2 positions shown · non-contrast
Comparison: 12/15/2014

CLINICAL DATA: Left-sided chest pain and dyspnea for 3 days.

EXAM:
CHEST  2 VIEW

[chest pa]
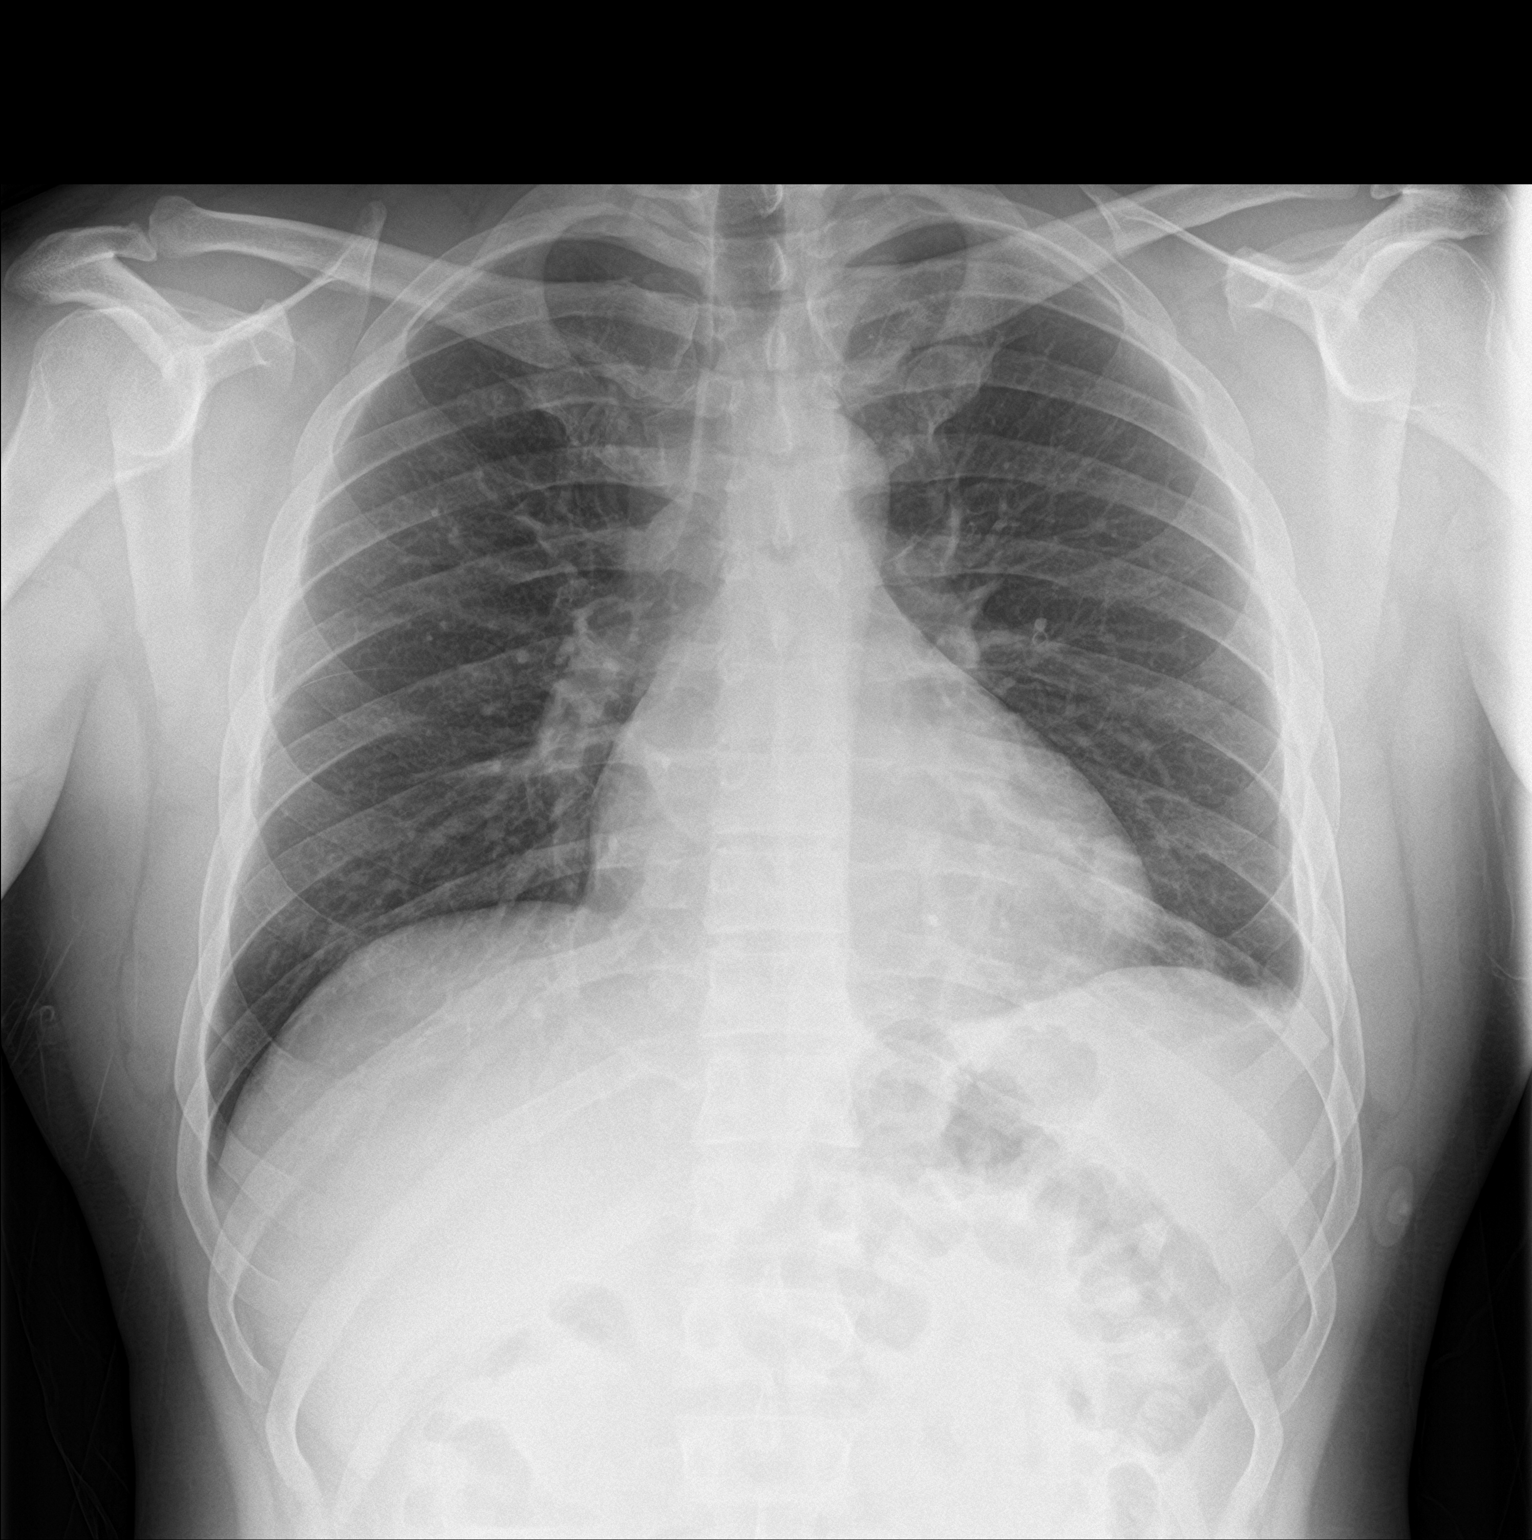

[chest lat]
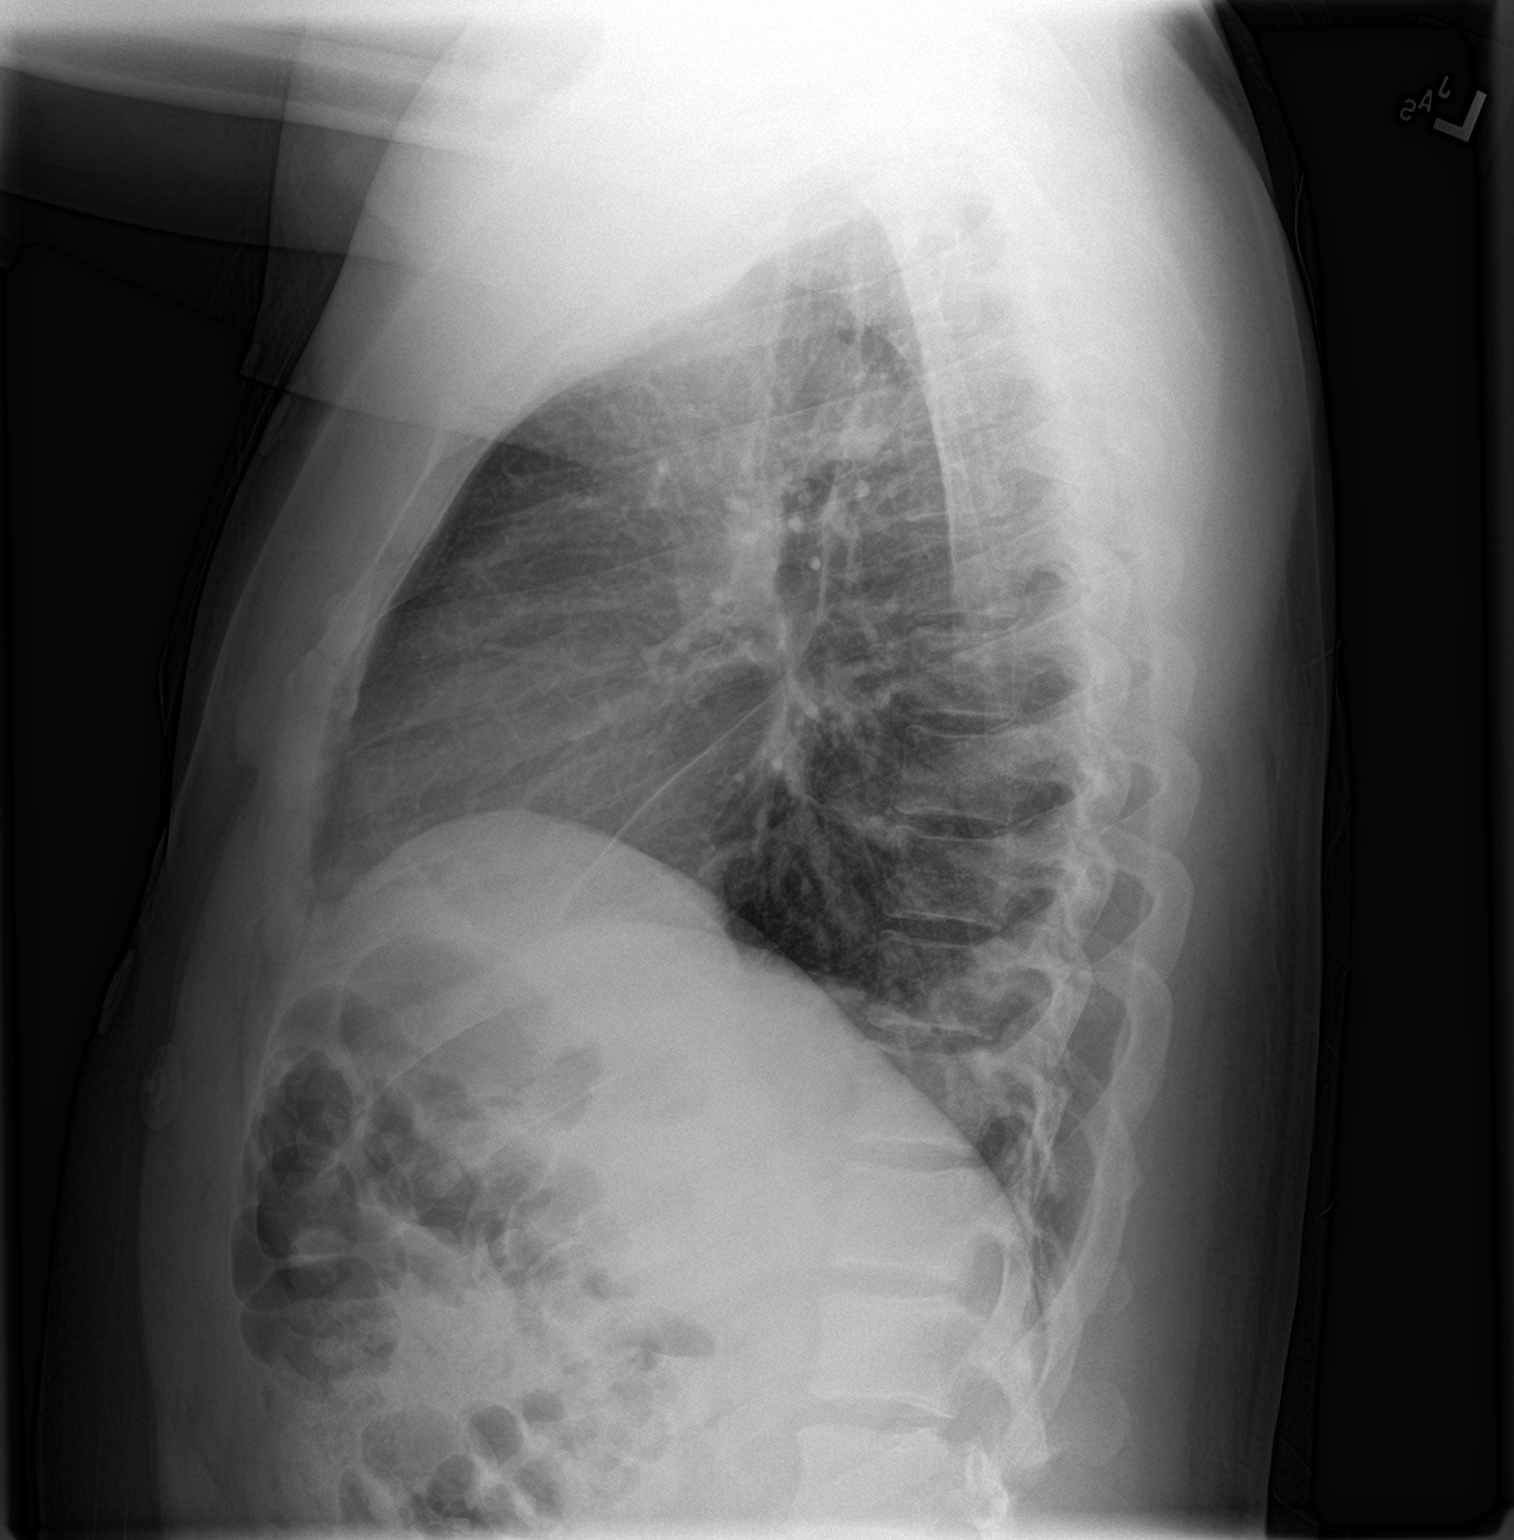

[2 of 2 positions shown; findings below may reference images not displayed]

FINDINGS: Blunting of the left lateral costophrenic angle, possibly a small
effusion although this is not confirmed on the lateral view. Mild
linear left base opacities. Right lung is clear. Pulmonary
vasculature is normal. Hilar and mediastinal contours are
unremarkable and unchanged. Heart size is normal.
IMPRESSION: Possible small left pleural effusion. No consolidation. Normal
vasculature.

## 2017-03-22 ENCOUNTER — Emergency Department (HOSPITAL_COMMUNITY)
Admission: EM | Admit: 2017-03-22 | Discharge: 2017-03-22 | Disposition: A | Discharge status: Home / Self Care | Payer: BLUE CROSS/BLUE SHIELD | Attending: Emergency Medicine | Admitting: Emergency Medicine

## 2017-03-22 ENCOUNTER — Emergency Department (HOSPITAL_COMMUNITY): Payer: BLUE CROSS/BLUE SHIELD

## 2017-03-22 ENCOUNTER — Encounter: Payer: BLUE CROSS/BLUE SHIELD | Admitting: Podiatry

## 2017-03-22 DIAGNOSIS — Z79899 Other long term (current) drug therapy: Secondary | ICD-10-CM | POA: Insufficient documentation

## 2017-03-22 DIAGNOSIS — M25572 Pain in left ankle and joints of left foot: Secondary | ICD-10-CM | POA: Insufficient documentation

## 2017-03-22 MED ORDER — DICLOFENAC SODIUM 50 MG PO TBEC: 50.0000 mg | DELAYED_RELEASE_TABLET | Freq: Two times a day (BID) | ORAL | 0 refills | Status: DC

## 2017-03-22 NOTE — ED Provider Notes (Signed)
[facility] DEPT Provider Note   CSN: [id number] Arrival date & time: 03/22/17  1021     History   Chief Complaint Chief Complaint  Patient presents with  . Ankle Pain    HPI Adam Prince is a 31 y.o. male who presents to the ED with ankle pain that started a month ago. The pain is located in the left lateral ankle. Patient reports that the pain started after he was playing basketball and he thought he sprained it but then the pain continues. He reports no swelling. He took ibuprofen initially but is not taking anything now. No hx of gout, no FH of gout or RA.  The history is provided by the patient. No language interpreter was used.  Ankle Pain   The incident occurred more than 1 week ago. The injury mechanism is unknown. The pain is present in the left ankle. The quality of the pain is described as sharp and burning. The pain is at a severity of 8/10. The pain has been constant since onset. He reports no foreign bodies present.    Past Medical History:  Diagnosis Date  . Achilles tendonitis 2016   right leg  . 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 Sleep disturbance 10/06/2015  . 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 Elevated serum creatinine 10/06/2015    Past Surgical History:  Procedure Laterality Date  . KNEE SURGERY    . TYMPANOSTOMY TUBE PLACEMENT         Home Medications    Prior to Admission medications   Medication Sig Start Date End Date Taking? Authorizing Provider  diclofenac (VOLTAREN) 50 MG EC tablet Take 1 tablet (50 mg total) by mouth 2 (two) times daily. 03/22/17   Ashley Murrain, NP  ibuprofen (ADVIL,MOTRIN) 600 MG tablet Take 1 tablet (600 mg total) by mouth every 6 (six) hours as needed for headache, mild pain or moderate pain. Reported on 10/06/2015 07/17/16   Charlann Lange, PA-C    Family History Family History  Problem Relation Age of Onset    . Atrial fibrillation Mother   . Asthma Brother     Social History Social History  Substance Use Topics  . Smoking status: Never Smoker  . Smokeless tobacco: Never Used  . Alcohol use Yes     Comment: social     Allergies   Sulfa antibiotics   Review of Systems Review of Systems  Constitutional: Negative for chills and fever.  HENT: Negative.   Gastrointestinal: Negative for nausea and vomiting.  Musculoskeletal: Positive for arthralgias.       Left ankle  Skin: Negative for wound.  Neurological: Negative for headaches.  Hematological: Does not bruise/bleed easily.  Psychiatric/Behavioral: The patient is not nervous/anxious.      Physical Exam Updated Vital Signs BP 135/82 (BP Location: Right Arm)   Pulse 81   Temp 98.2 F (36.8 C) (Oral)   Resp 16   SpO2 99%   Physical Exam  Constitutional: He is oriented to person, place, and time. He appears well-developed and well-nourished. No distress.  HENT:  Head: Normocephalic and atraumatic.  Eyes: EOM are normal.  Neck: Neck supple.  Cardiovascular: Normal rate.   Pulmonary/Chest: Effort normal.  Musculoskeletal:       Left ankle: He exhibits abnormal pulse. He exhibits normal range of motion, no swelling, no ecchymosis, no deformity and no laceration.  Tenderness. Lateral malleolus tenderness found. Achilles tendon exhibits no pain and no defect.  Pedal pulse 2+, adequate circulation. Pain with dorsiflexion of left foot.   Neurological: He is alert and oriented to person, place, and time. No cranial nerve deficit.  Skin: Skin is warm and dry.  Psychiatric: He has a normal mood and affect.  Nursing note and vitals reviewed.    ED Treatments / Results  Labs (all labs ordered are listed, but only abnormal results are displayed) Labs Reviewed - No data to display  Radiology Dg Ankle Complete Left  Result Date: 03/22/2017 CLINICAL DATA:  Left lateral ankle pain for 1 month.  No trauma. EXAM: LEFT ANKLE  COMPLETE - 3+ VIEW COMPARISON:  None. FINDINGS: No acute fracture or malalignment. No joint effusion. The talar dome is intact. The ankle mortise is symmetric. Ossific irregularity along the medial malleolus and distal tibiofibular syndesmosis is likely related to prior injury. Small Achilles enthesophyte. The soft tissues are unremarkable. IMPRESSION: No acute osseous abnormality. Electronically Signed   By: Titus Dubin M.D.   On: 03/22/2017 10:55    Procedures Procedures (including critical care time)  Medications Ordered in ED Medications - No data to display   Initial Impression / Assessment and Plan / ED Course  I have reviewed the triage vital signs and the nursing notes.  Pertinent  imaging results that were available during my care of the patient were reviewed by me and considered in my medical decision making (see chart for details). Patient stable for d/c without focal neuro deficits and no acute fracture or dislocation noted on x-ray. ASO, NSAIDS and f/u with ortho.     Final Clinical Impressions(s) / ED Diagnoses   Final diagnoses:  Left ankle pain, unspecified chronicity    New Prescriptions New Prescriptions   DICLOFENAC (VOLTAREN) 50 MG EC TABLET    Take 1 tablet (50 mg total) by mouth 2 (two) times daily.     Debroah Baller [place], [place] 03/22/17 1126    Quintella Reichert, MD 03/23/17 [phone]

## 2017-03-22 NOTE — Discharge Instructions (Signed)
Call Dr. Amalia Hailey' office today to schedule a follow up appointment.

## 2017-03-22 NOTE — ED Triage Notes (Signed)
Pt states he has had L lateral ankle pain x 1 month. Worse upon plantar flexion. Alert and oriented.

## 2017-03-26 ENCOUNTER — Emergency Department (HOSPITAL_COMMUNITY)
Admission: EM | Admit: 2017-03-26 | Discharge: 2017-03-26 | Disposition: A | Discharge status: Home / Self Care | Payer: BLUE CROSS/BLUE SHIELD | Attending: Emergency Medicine | Admitting: Emergency Medicine

## 2017-03-26 ENCOUNTER — Encounter (HOSPITAL_COMMUNITY): Payer: Self-pay | Admitting: *Deleted

## 2017-03-26 ENCOUNTER — Emergency Department (HOSPITAL_COMMUNITY): Payer: BLUE CROSS/BLUE SHIELD

## 2017-03-26 DIAGNOSIS — R0602 Shortness of breath: Secondary | ICD-10-CM | POA: Diagnosis not present

## 2017-03-26 DIAGNOSIS — R05 Cough: Secondary | ICD-10-CM | POA: Diagnosis present

## 2017-03-26 DIAGNOSIS — J40 Bronchitis, not specified as acute or chronic: Secondary | ICD-10-CM | POA: Diagnosis not present

## 2017-03-26 LAB — BRAIN NATRIURETIC PEPTIDE: B Natriuretic Peptide: 31.3 pg/mL (ref 0.0–100.0)

## 2017-03-26 LAB — I-STAT CHEM 8, ED
BUN: 5 mg/dL — AB (ref 6–20)
CALCIUM ION: 1.2 mmol/L (ref 1.15–1.40)
Chloride: 103 mmol/L (ref 101–111)
Creatinine, Ser: 1.3 mg/dL — ABNORMAL HIGH (ref 0.61–1.24)
Glucose, Bld: 88 mg/dL (ref 65–99)
HCT: 45 % (ref 39.0–52.0)
Hemoglobin: 15.3 g/dL (ref 13.0–17.0)
Potassium: 4.2 mmol/L (ref 3.5–5.1)
SODIUM: 141 mmol/L (ref 135–145)
TCO2: 29 mmol/L (ref 22–32)

## 2017-03-26 LAB — RAPID STREP SCREEN (MED CTR MEBANE ONLY): Streptococcus, Group A Screen (Direct): NEGATIVE

## 2017-03-26 MED ORDER — ALBUTEROL SULFATE HFA 108 (90 BASE) MCG/ACT IN AERS
2.0000 | INHALATION_SPRAY | RESPIRATORY_TRACT | Status: DC | PRN
  Administered 2017-03-26: 2 via RESPIRATORY_TRACT
  Filled 2017-03-26: qty 6.7

## 2017-03-26 MED ORDER — AEROCHAMBER PLUS W/MASK MISC
1.0000 | Freq: Once | Status: AC
Start: 2017-03-26 — End: 2017-03-26
  Administered 2017-03-26: 1
  Filled 2017-03-26: qty 1

## 2017-03-26 NOTE — Discharge Instructions (Signed)
Use your inhaler with spacer 2 puffs every 4 hours as needed for cough or shortness of breath. Follow-up with Dr. Baird Cancer if not better in one or 2 weeks.

## 2017-03-26 NOTE — ED Triage Notes (Signed)
Patient is alert and oriented x4.  He is being seen for a non productive cough lasting 10 days.  Patient denies any pain with this cough and also denies any Hx of this issue.

## 2017-03-26 NOTE — ED Provider Notes (Signed)
[facility] DEPT Provider Note   CSN: [id number] Arrival date & time: 03/26/17  0748     History   Chief Complaint Chief Complaint  Patient presents with  . Cough    HPI Adam Prince is a 31 y.o. male.  HPI Complains of cough nonproductive for 10 days. He complains of shortness of breath worse with lying supine. Maximum temperature 100. He also complains of mild sore throat and mild right ear pain. No treatment prior to coming here. Nothing makes symptoms better or worse. No other associated symptoms Past Medical History:  Diagnosis Date  . Achilles tendonitis 2016   right leg  . 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 Sleep disturbance 10/06/2015  . 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 Elevated serum creatinine 10/06/2015    Past Surgical History:  Procedure Laterality Date  . KNEE SURGERY    . TYMPANOSTOMY TUBE PLACEMENT         Home Medications    Prior to Admission medications   Medication Sig Start Date End Date Taking? Authorizing Provider  diclofenac (VOLTAREN) 50 MG EC tablet Take 1 tablet (50 mg total) by mouth 2 (two) times daily. 03/22/17   Ashley Murrain, NP  ibuprofen (ADVIL,MOTRIN) 600 MG tablet Take 1 tablet (600 mg total) by mouth every 6 (six) hours as needed for headache, mild pain or moderate pain. Reported on 10/06/2015 07/17/16   Charlann Lange, PA-C    Family History Family History  Problem Relation Age of Onset  . Atrial fibrillation Mother   . Asthma Brother     Social History Social History  Substance Use Topics  . Smoking status: Never Smoker  . Smokeless tobacco: Never Used  . Alcohol use Yes     Comment: social  Denies alcohol denies drug use denies tobacco Allergies   Sulfa antibiotics   Review of Systems Review of Systems  Constitutional: Negative.   HENT: Positive for ear pain and sore throat.        Right ear pain    Respiratory: Positive for cough and shortness of breath.   Cardiovascular: Negative.   Gastrointestinal: Negative.   Musculoskeletal: Positive for arthralgias.       Right ankle pain for one month  Skin: Negative.   Neurological: Negative.   Psychiatric/Behavioral: Negative.   All other systems reviewed and are negative.    Physical Exam Updated Vital Signs BP 134/87 (BP Location: Left Arm)   Pulse 63   Temp 98.1 F (36.7 C) (Oral)   Resp 18   Ht 5\' 6"  (1.676 m)   Wt 83.9 kg (185 lb)   SpO2 98%   BMI 29.86 kg/m   Physical Exam  Constitutional: He is oriented to person, place, and time. He appears well-developed and well-nourished. No distress.  HENT:  Head: Normocephalic and atraumatic.  Right Ear: External ear normal.  Left Ear: External ear normal.  Oral friends minimally reddened. No tonsillar exudate or enlargement. Uvula midline. Voice normal  Eyes: EOM are normal.  Neck: Normal range of motion.  Cardiovascular: Normal rate.   Pulmonary/Chest: Effort normal and breath sounds normal.  Musculoskeletal: Normal range of motion. He exhibits no edema.  Neurological: He is alert and oriented to person, place, and time. No cranial nerve deficit.  Skin: Skin is warm and dry. No rash noted.  Psychiatric: He has a normal mood and affect.  Nursing note and vitals reviewed.    ED Treatments / Results  Labs (all labs ordered are listed, but only abnormal results are displayed) Labs Reviewed  RAPID STREP SCREEN (NOT AT [facility])    EKG  EKG Interpretation None       Radiology No results found.  Procedures Procedures (including critical care time) I-STAT 8 normal sodium 141 potassium 4.2 chloride 103 bicarbonate 29 glucose 88 BUN 5 creatinine 1.3 hemoglobin 15.3 Medications Ordered in ED Medications - No data to display  Chest x-ray viewed by me Results for orders placed or performed during the hospital encounter of 03/26/17  Rapid strep screen  Result Value  Ref Range   Streptococcus, Group A Screen (Direct) NEGATIVE NEGATIVE  Brain natriuretic peptide  Result Value Ref Range   B Natriuretic Peptide 31.3 0.0 - 100.0 pg/mL   Dg Chest 2 View  Result Date: 03/26/2017 CLINICAL DATA:  Dry cough for the past week. Left upper chest pain when coughing. EXAM: CHEST  2 VIEW COMPARISON:  07/17/2016. FINDINGS: Poor inspiration. The cardiac silhouette remains borderline enlarged and the pulmonary vasculature is mildly prominent. Clear lungs. Unremarkable bones. IMPRESSION: Borderline cardiomegaly and mild pulmonary vascular congestion, accentuated by a poor inspiration. Electronically Signed   By: Claudie Revering M.D.   On: 03/26/2017 09:42   Dg Ankle Complete Left  Result Date: 03/22/2017 CLINICAL DATA:  Left lateral ankle pain for 1 month.  No trauma. EXAM: LEFT ANKLE COMPLETE - 3+ VIEW COMPARISON:  None. FINDINGS: No acute fracture or malalignment. No joint effusion. The talar dome is intact. The ankle mortise is symmetric. Ossific irregularity along the medial malleolus and distal tibiofibular syndesmosis is likely related to prior injury. Small Achilles enthesophyte. The soft tissues are unremarkable. IMPRESSION: No acute osseous abnormality. Electronically Signed   By: Titus Dubin M.D.   On: 03/22/2017 10:55  Chest x-ray viewed by me Initial Impression / Assessment and Plan / ED Course  I have reviewed the triage vital signs and the nursing notes.  Pertinent labs & imaging results that were available during my care of the patient were reviewed by me and considered in my medical decision making (see chart for details).     Symptoms consistent with acute bronchitis plan albuterol inhaler with spacer to go to use 2 puffs every 4 hours when necessary cough or shortness of breath. Follow-up with Dr. Baird Cancer not better in one or 2 weeks  Final Clinical Impressions(s) / ED Diagnoses  Diagnosis acute bronchitis Final diagnoses:  None    New  Prescriptions New Prescriptions   No medications on file     Orlie Dakin, MD 03/26/17 1239

## 2017-03-26 NOTE — ED Notes (Signed)
Bed: WTR6 Expected date:  Expected time:  Means of arrival:  Comments: 

## 2017-03-28 LAB — CULTURE, GROUP A STREP (THRC)

## 2017-03-29 NOTE — Progress Notes (Signed)
This encounter was created in error - please disregard.

## 2017-04-05 ENCOUNTER — Ambulatory Visit: Payer: BLUE CROSS/BLUE SHIELD | Admitting: Podiatry

## 2017-10-12 DIAGNOSIS — R05 Cough: Secondary | ICD-10-CM | POA: Insufficient documentation

## 2017-10-12 DIAGNOSIS — M940 Chondrocostal junction syndrome [Tietze]: Secondary | ICD-10-CM | POA: Insufficient documentation

## 2017-10-13 ENCOUNTER — Encounter (HOSPITAL_COMMUNITY): Payer: Self-pay | Admitting: Emergency Medicine

## 2017-10-13 ENCOUNTER — Emergency Department (HOSPITAL_COMMUNITY): Payer: Self-pay

## 2017-10-13 ENCOUNTER — Other Ambulatory Visit: Payer: Self-pay

## 2017-10-13 ENCOUNTER — Emergency Department (HOSPITAL_COMMUNITY)
Admission: EM | Admit: 2017-10-13 | Discharge: 2017-10-13 | Disposition: A | Discharge status: Home / Self Care | Payer: Self-pay | Attending: Emergency Medicine | Admitting: Emergency Medicine

## 2017-10-13 DIAGNOSIS — R6889 Other general symptoms and signs: Secondary | ICD-10-CM

## 2017-10-13 DIAGNOSIS — M940 Chondrocostal junction syndrome [Tietze]: Secondary | ICD-10-CM

## 2017-10-13 MED ORDER — IBUPROFEN 600 MG PO TABS: 600.0000 mg | ORAL_TABLET | Freq: Four times a day (QID) | ORAL | 0 refills | Status: DC | PRN

## 2017-10-13 MED ORDER — ACETAMINOPHEN 325 MG PO TABS
650.0000 mg | ORAL_TABLET | Freq: Once | ORAL | Status: AC
  Administered 2017-10-13: 650 mg via ORAL
  Filled 2017-10-13: qty 2

## 2017-10-13 MED ORDER — ALBUTEROL SULFATE HFA 108 (90 BASE) MCG/ACT IN AERS
1.0000 | INHALATION_SPRAY | Freq: Four times a day (QID) | RESPIRATORY_TRACT | Status: DC | PRN
  Administered 2017-10-13: 2 via RESPIRATORY_TRACT
  Filled 2017-10-13: qty 6.7

## 2017-10-13 MED ORDER — BENZONATATE 100 MG PO CAPS: 100.0000 mg | ORAL_CAPSULE | Freq: Three times a day (TID) | ORAL | 0 refills | Status: DC | PRN

## 2017-10-13 MED ORDER — IBUPROFEN 800 MG PO TABS
800.0000 mg | ORAL_TABLET | Freq: Once | ORAL | Status: AC
  Administered 2017-10-13: 800 mg via ORAL
  Filled 2017-10-13: qty 1

## 2017-10-13 MED ORDER — ACETAMINOPHEN 500 MG PO TABS: 1000.0000 mg | ORAL_TABLET | Freq: Three times a day (TID) | ORAL | 0 refills | Status: AC | PRN

## 2017-10-13 NOTE — ED Triage Notes (Signed)
Pt arriving from home with complaint of generalized pain along with chest pain associated with consistent coughing. Pt states the chest pain is "just there" and mainly on the left side. Pt denies any cardiac history.

## 2017-10-13 NOTE — ED Provider Notes (Signed)
[facility] DEPT Provider Note   CSN: [id number] Arrival date & time: 10/12/17  2338     History   Chief Complaint Chief Complaint  Patient presents with  . Generalized pain    HPI ONEY Prince is a 32 y.o. male.   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  Symptoms have been associated with chills as well as a dry, nonproductive cough.  He has noted some pain to his left chest, specifically under his left pectoral muscle.  This is worse with coughing and deep breathing.  Symptoms have been present over the past 2 days.  He has taken some DayQuil for symptoms without relief.  He denies any known sick contacts.  No associated vomiting, though he has had some nonbloody diarrhea.  He was noted to be febrile while in the emergency department today.  The history is provided by the patient. No language interpreter was used.    Past Medical History:  Diagnosis Date  . Achilles tendonitis 2016   right leg  . 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 Sleep disturbance 10/06/2015  . 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 Elevated serum creatinine 10/06/2015    Past Surgical History:  Procedure Laterality Date  . KNEE SURGERY    . TYMPANOSTOMY TUBE PLACEMENT         Home Medications    Prior to Admission medications   Medication Sig Start Date End Date Taking? Authorizing Provider  ascorbic acid (VITAMIN C) 250 MG CHEW Chew 500 mg by mouth daily.   Yes [provider]  Phenylephrine-DM-GG-APAP (DAYQUIL SEVERE + VAPOCOOL) 5-10-200-325 MG/15ML LIQD Take 30 mLs by mouth every 8 (eight) hours as needed (cough).   Yes [provider]  acetaminophen (TYLENOL) 500 MG tablet Take 2 tablets (1,000 mg total) by mouth every 8 (eight) hours as  needed for fever. 10/13/17   Antonietta Breach, PA-C  benzonatate (TESSALON) 100 MG capsule Take 1-2 capsules (100-200 mg total) by mouth 3 (three) times daily as needed for cough. 10/13/17   Antonietta Breach, PA-C  diclofenac (VOLTAREN) 50 MG EC tablet Take 1 tablet (50 mg total) by mouth 2 (two) times daily. Patient not taking: Reported on 10/13/2017 03/22/17   Ashley Murrain, NP  ibuprofen (ADVIL,MOTRIN) 600 MG tablet Take 1 tablet (600 mg total) by mouth every 6 (six) hours as needed for headache, mild pain or moderate pain. Reported on 10/06/2015 10/13/17   Antonietta Breach, PA-C    Family History Family History  Problem Relation Age of Onset  . Atrial fibrillation Mother   . Asthma Brother     Social History Social History   Tobacco Use  . Smoking status: Never Smoker  . Smokeless tobacco: Never Used  Substance Use Topics  . Alcohol use: Yes    Comment: social  . Drug use: No     Allergies   Sulfa antibiotics   Review of Systems Review of Systems Ten systems reviewed and are negative for acute change, except as noted in the HPI.    Physical Exam Updated Vital Signs BP 124/89 (BP Location: Left Arm)   Pulse 98   Temp 99.8 F (37.7 C) (Oral)   Resp 18   SpO2 96%   Physical Exam  Constitutional: He is oriented to person,  place, and time. He appears well-developed and well-nourished. No distress.  Nontoxic appearing and in NAD  HENT:  Head: Normocephalic and atraumatic.  Eyes: Conjunctivae and EOM are normal. No scleral icterus.  Neck: Normal range of motion.  Cardiovascular: Regular rhythm and intact distal pulses.  Mild tachycardia  Pulmonary/Chest: Effort normal. No stridor. No respiratory distress. He has no wheezes.  Lungs CTAB. Chest expansion symmetric. Mild TTP to the left lateral chest wall without crepitus or deformity.    Musculoskeletal: Normal range of motion.  Neurological: He is alert and oriented to person, place, and time. He exhibits normal muscle tone.  Coordination normal.  Skin: Skin is warm and dry. No rash noted. He is not diaphoretic. No erythema. No pallor.  Psychiatric: He has a normal mood and affect. His behavior is normal.  Nursing note and vitals reviewed.    ED Treatments / Results  Labs (all labs ordered are listed, but only abnormal results are displayed) Labs Reviewed - No data to display  EKG  EKG Interpretation  Date/Time:  Friday October 13 2017 00:02:52 EDT Ventricular Rate:  106 PR Interval:    QRS Duration: 74 QT Interval:  304 QTC Calculation: 404 R Axis:   41 Text Interpretation:  Sinus tachycardia Otherwise normal ECG Rate is faster Confirmed by Shanon Rosser [phone]) on 10/13/2017 1:41:42 AM       Radiology Dg Chest 2 View  Result Date: 10/13/2017 CLINICAL DATA:  Fever and anterior chest pain for 24 hours. EXAM: CHEST - 2 VIEW COMPARISON:  03/26/2017 FINDINGS: Heart size is top-normal. No aortic aneurysm. No acute pneumonic consolidation or CHF. No effusion or pneumothorax. Both lungs are clear. The visualized skeletal structures are unremarkable. IMPRESSION: No active cardiopulmonary disease. Electronically Signed   By: Ashley Royalty M.D.   On: 10/13/2017 03:16    Procedures Procedures (including critical care time)  Medications Ordered in ED Medications  albuterol (PROVENTIL HFA;VENTOLIN HFA) 108 (90 Base) MCG/ACT inhaler 1-2 puff (2 puffs Inhalation Given 10/13/17 0446)  acetaminophen (TYLENOL) tablet 650 mg (650 mg Oral Given 10/13/17 0229)  ibuprofen (ADVIL,MOTRIN) tablet 800 mg (800 mg Oral Given 10/13/17 0445)     Initial Impression / Assessment and Plan / ED Course  I have reviewed the triage vital signs and the nursing notes.  Pertinent labs & imaging results that were available during my care of the patient were reviewed by me and considered in my medical decision making (see chart for details).     Pt CXR negative for acute infiltrate. Patient's symptoms are consistent with flu-like  illness. Chest pain c/w costochondritis. Doubt cardiac etiology of symptoms. Discussed that antibiotics are not indicated for viral infections. Pt will be discharged with symptomatic treatment. He verbalizes understanding and is agreeable with plan. Return precautions discussed and provided. Patient discharged in stable condition with no unaddressed concerns.   Vitals:   10/12/17 2358 10/13/17 0205 10/13/17 0442  BP: 138/82  124/89  Pulse: (!) 109  98  Resp: 18  18  Temp: 99.9 F (37.7 C) (!) 101.3 F (38.5 C) 99.8 F (37.7 C)  TempSrc: Oral  Oral  SpO2: 97%  96%    Final Clinical Impressions(s) / ED Diagnoses   Final diagnoses:  Flu-like symptoms  Costochondritis    ED Discharge Orders        Ordered    ibuprofen (ADVIL,MOTRIN) 600 MG tablet  Every 6 hours PRN     10/13/17 0412    acetaminophen (TYLENOL) 500 MG tablet  Every 8 hours PRN     10/13/17 0412    benzonatate (TESSALON) 100 MG capsule  3 times daily PRN     10/13/17 0412       Antonietta Breach, PA-C 10/13/17 0548    Molpus, Jenny Reichmann, MD 10/13/17 [phone]

## 2017-10-13 NOTE — Discharge Instructions (Signed)
Use 2 puffs of an albuterol inhaler every 4-6 hours as needed for cough, wheezing, shortness of breath.  You may supplement this with Tessalon as needed for cough management.  Continue with over-the-counter remedies as needed.  We advise Tylenol as prescribed for fever/chills and ibuprofen for body aches or headache.  Follow-up with your primary care doctor to ensure resolution of symptoms.

## 2017-10-17 ENCOUNTER — Encounter (HOSPITAL_COMMUNITY): Payer: Self-pay

## 2017-10-17 ENCOUNTER — Emergency Department (HOSPITAL_COMMUNITY)
Admission: EM | Admit: 2017-10-17 | Discharge: 2017-10-17 | Disposition: A | Discharge status: Home / Self Care | Payer: Self-pay | Attending: Emergency Medicine | Admitting: Emergency Medicine

## 2017-10-17 DIAGNOSIS — R05 Cough: Secondary | ICD-10-CM | POA: Insufficient documentation

## 2017-10-17 DIAGNOSIS — R059 Cough, unspecified: Secondary | ICD-10-CM

## 2017-10-17 MED ORDER — HYDROCODONE-HOMATROPINE 5-1.5 MG/5ML PO SYRP: 5.0000 mL | ORAL_SOLUTION | Freq: Four times a day (QID) | ORAL | 0 refills | Status: DC | PRN

## 2017-10-17 MED ORDER — AZITHROMYCIN 250 MG PO TABS: 250.0000 mg | ORAL_TABLET | Freq: Every day | ORAL | 0 refills | Status: DC

## 2017-10-17 NOTE — ED Provider Notes (Signed)
[facility] DEPT Provider Note   CSN: [id number] Arrival date & time: 10/17/17  0455     History   Chief Complaint Chief Complaint  Patient presents with  . Cough    HPI Adam Prince is a 32 y.o. male.   Cough  This is a new problem. The current episode started more than 1 week ago. The problem has been gradually worsening. The cough is productive of sputum. The maximum temperature recorded prior to his arrival was 101 to 101.9 F. Associated symptoms include ear congestion, rhinorrhea and sore throat. Pertinent negatives include no ear pain, no headaches and no eye redness. He has tried decongestants for the symptoms. The treatment provided no relief. He is not a smoker.    Past Medical History:  Diagnosis Date  . Achilles tendonitis 2016   right leg  . 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 Sleep disturbance 10/06/2015  . 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 Elevated serum creatinine 10/06/2015    Past Surgical History:  Procedure Laterality Date  . KNEE SURGERY    . TYMPANOSTOMY TUBE PLACEMENT          Home Medications    Prior to Admission medications   Medication Sig Start Date End Date Taking? Authorizing Provider  acetaminophen (TYLENOL) 500 MG tablet Take 2 tablets (1,000 mg total) by mouth every 8 (eight) hours as needed for fever. 10/13/17  Yes Antonietta Breach, PA-C  ascorbic acid (VITAMIN C) 250 MG CHEW Chew 500 mg by mouth daily.   Yes [provider]  benzonatate (TESSALON) 100 MG capsule Take 1-2 capsules (100-200 mg total) by mouth 3 (three) times daily as needed for cough. 10/13/17  Yes Antonietta Breach, PA-C  ibuprofen (ADVIL,MOTRIN) 600 MG tablet Take 1 tablet (600 mg total) by mouth every 6 (six) hours as needed for headache, mild pain or moderate pain. Reported on 10/06/2015 10/13/17  Yes Antonietta Breach, PA-C    Phenylephrine-DM-GG-APAP (DAYQUIL SEVERE + VAPOCOOL) 5-10-200-325 MG/15ML LIQD Take 30 mLs by mouth every 8 (eight) hours as needed (cough).   Yes [provider]  azithromycin (ZITHROMAX) 250 MG tablet Take 1 tablet (250 mg total) by mouth daily. Take first 2 tablets together, then 1 every day until finished. 10/17/17   Gelila Well, Corene Cornea, MD  HYDROcodone-homatropine [facility]) 5-1.5 MG/5ML syrup Take 5 mLs by mouth every 6 (six) hours as needed for cough. 10/17/17   Ivana Nicastro, Corene Cornea, MD    Family History Family History  Problem Relation Age of Onset  . Atrial fibrillation Mother   . Asthma Brother     Social History Social History   Tobacco Use  . Smoking status: Never Smoker  . Smokeless tobacco: Never Used  Substance Use Topics  . Alcohol use: Yes    Comment: social  . Drug use: No     Allergies   Sulfa antibiotics   Review of Systems Review of Systems  HENT: Positive for rhinorrhea and sore throat. Negative for ear pain.   Eyes: Negative for redness.  Respiratory: Positive for cough.   Neurological: Negative for headaches.  All other systems reviewed and are negative.    Physical Exam Updated Vital Signs BP 124/87 (BP Location: Right Arm)   Pulse 81   Temp 98.3 F (36.8 C) (Oral)   Resp 14   Ht 5\' 6"  (1.676 m)   Wt 81.6  kg (180 lb)   SpO2 99%   BMI 29.05 kg/m   Physical Exam  Constitutional: He is oriented to person, place, and time. He appears well-developed and well-nourished.  HENT:  Head: Normocephalic and atraumatic.  Eyes: Conjunctivae and EOM are normal.  Neck: Normal range of motion.  Cardiovascular: Normal rate.  Pulmonary/Chest: Effort normal. No respiratory distress. He has rales (in LLL >RLL).  Abdominal: Soft. Bowel sounds are normal. He exhibits distension.  Musculoskeletal: Normal range of motion. He exhibits no edema or deformity.  Neurological: He is alert and oriented to person, place, and time. No cranial nerve deficit.  Coordination normal.  Nursing note and vitals reviewed.    ED Treatments / Results  Labs (all labs ordered are listed, but only abnormal results are displayed) Labs Reviewed - No data to display  EKG None  Radiology No results found.  Procedures Procedures (including critical care time)  Medications Ordered in ED Medications - No data to display   Initial Impression / Assessment and Plan / ED Course  I have reviewed the triage vital signs and the nursing notes.  Pertinent labs & imaging results that were available during my care of the patient were reviewed by me and considered in my medical decision making (see chart for details).     Suspect he has an atypical pneumonia since he has posttussive emesis and persistent dry somewhat productive cough.  He does have rales in bilateral lower lobes however I think the likelihood he has heart failure without any lower extremity edema, shortness of breath, JVD or S3.  No history of hypertension or any other risk factors for heart failure.  Will treat with Z-Pak discharge  Final Clinical Impressions(s) / ED Diagnoses   Final diagnoses:  Cough    ED Discharge Orders        Ordered    azithromycin (ZITHROMAX) 250 MG tablet  Daily     10/17/17 0732    HYDROcodone-homatropine (HYCODAN) 5-1.5 MG/5ML syrup  Every 6 hours PRN     10/17/17 0732       Caitriona Sundquist, Corene Cornea, MD 10/17/17 [phone]

## 2017-10-17 NOTE — ED Triage Notes (Signed)
Pt was seen for the same 4 days ago and received tessalon pearls and he states they haven't worked, he now complains of a sore throat

## 2017-10-17 NOTE — ED Notes (Signed)
Bed: WTR6 Expected date:  Expected time:  Means of arrival:  Comments: 

## 2017-10-17 NOTE — ED Notes (Signed)
Bed: WTR5 Expected date:  Expected time:  Means of arrival:  Comments: 

## 2019-01-23 HISTORY — PX: CHOLECYSTECTOMY: SHX55

## 2019-09-22 ENCOUNTER — Other Ambulatory Visit: Payer: Self-pay

## 2019-09-22 ENCOUNTER — Emergency Department (HOSPITAL_COMMUNITY)
Admission: EM | Admit: 2019-09-22 | Discharge: 2019-09-22 | Disposition: A | Discharge status: Home / Self Care | Attending: Emergency Medicine | Admitting: Emergency Medicine

## 2019-09-22 ENCOUNTER — Encounter (HOSPITAL_COMMUNITY): Payer: Self-pay

## 2019-09-22 ENCOUNTER — Emergency Department (HOSPITAL_COMMUNITY)

## 2019-09-22 DIAGNOSIS — Y9367 Activity, basketball: Secondary | ICD-10-CM | POA: Insufficient documentation

## 2019-09-22 DIAGNOSIS — M79661 Pain in right lower leg: Secondary | ICD-10-CM | POA: Insufficient documentation

## 2019-09-22 DIAGNOSIS — Z79899 Other long term (current) drug therapy: Secondary | ICD-10-CM | POA: Diagnosis not present

## 2019-09-22 DIAGNOSIS — M7660 Achilles tendinitis, unspecified leg: Secondary | ICD-10-CM

## 2019-09-22 MED ORDER — HYDROCODONE-ACETAMINOPHEN 5-325 MG PO TABS
1.0000 | ORAL_TABLET | Freq: Once | ORAL | Status: AC
  Administered 2019-09-22: 19:00:00 1 via ORAL
  Filled 2019-09-22: qty 1

## 2019-09-22 MED ORDER — NAPROXEN 500 MG PO TABS: 500.0000 mg | ORAL_TABLET | Freq: Two times a day (BID) | ORAL | 0 refills | Status: DC

## 2019-09-22 NOTE — ED Triage Notes (Signed)
Pt c/o pain in right lower calf while playing basketball, pt states pain started first, then patient fell today.

## 2019-09-22 NOTE — ED Provider Notes (Signed)
[facility] DEPT Provider Note   CSN: [id number] Arrival date & time: 09/22/19  1726     History Chief Complaint  Patient presents with  . Right Leg Pain   JANOAH SAVOCA is a 34 y.o. male with a past medical history significant for right Achilles tendinitis and Langerhans' cell histiocytosis who presents to the ED due to sudden onset of right Achilles and calf pain that started 30 minutes prior to arrival.  Patient states he was running while playing basketball and felt a pop in his Achilles tendon and braced himself to the ground.  Patient states he felt like someone threw an object at his Achilles tendon.  Pain is worse with movement and ambulation.  Denies head injury and loss of consciousness.  Denies recent antibiotics.  No treatment prior to arrival.  Denies history of blood clots, recent surgeries, recent long immobilizations, and hormonal treatments.  No associative edema, erythema, or warmth.  Denies chest pain or shortness of breath.  No other injuries.      Past Medical History:  Diagnosis Date  . Achilles tendonitis 2016   right leg  . 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 Sleep disturbance 10/06/2015  . 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 Elevated serum creatinine 10/06/2015    Past Surgical History:  Procedure Laterality Date  . CHOLECYSTECTOMY    . KNEE SURGERY    . TYMPANOSTOMY TUBE PLACEMENT         Family History  Problem Relation Age of Onset  . Atrial fibrillation Mother   . Asthma Brother     Social History   Tobacco Use  . Smoking status: Never Smoker  . Smokeless tobacco: Never Used  Substance Use Topics  . Alcohol use: Yes    Comment: social  . Drug use: No    Home Medications Prior to Admission medications   Medication Sig Start Date End Date Taking? Authorizing Provider  acetaminophen (TYLENOL) 500 MG  tablet Take 2 tablets (1,000 mg total) by mouth every 8 (eight) hours as needed for fever. 10/13/17   Antonietta Breach, PA-C  ascorbic acid (VITAMIN C) 250 MG CHEW Chew 500 mg by mouth daily.    [provider]  azithromycin (ZITHROMAX) 250 MG tablet Take 1 tablet (250 mg total) by mouth daily. Take first 2 tablets together, then 1 every day until finished. 10/17/17   Mesner, Corene Cornea, MD  benzonatate (TESSALON) 100 MG capsule Take 1-2 capsules (100-200 mg total) by mouth 3 (three) times daily as needed for cough. 10/13/17   Antonietta Breach, PA-C  HYDROcodone-homatropine (HYCODAN) 5-1.5 MG/5ML syrup Take 5 mLs by mouth every 6 (six) hours as needed for cough. 10/17/17   Mesner, Corene Cornea, MD  ibuprofen (ADVIL,MOTRIN) 600 MG tablet Take 1 tablet (600 mg total) by mouth every 6 (six) hours as needed for headache, mild pain or moderate pain. Reported on 10/06/2015 10/13/17   Antonietta Breach, PA-C  naproxen (NAPROSYN) 500 MG tablet Take 1 tablet (500 mg total) by mouth 2 (two) times daily. 09/22/19   Suzy Bouchard, PA-C  Phenylephrine-DM-GG-APAP (DAYQUIL SEVERE + VAPOCOOL) 5-10-200-325 MG/15ML LIQD Take 30 mLs by mouth every 8 (eight) hours as needed (cough).    [provider]    Allergies    Sulfa antibiotics  Review of Systems   Review of Systems  Constitutional: Negative for chills and fever.  Respiratory: Negative for shortness of breath.   Cardiovascular: Negative for chest pain.  Musculoskeletal: Positive for arthralgias, gait problem and myalgias.  Skin: Negative for color change and wound.    Physical Exam Updated Vital Signs BP 127/90 (BP Location: Left Arm)   Pulse 96   Temp 98.4 F (36.9 C) (Oral)   Resp 18   Ht 5\' 7"  (1.702 m)   Wt 83.9 kg   SpO2 96%   BMI 28.98 kg/m   Physical Exam Vitals and nursing note reviewed.  Constitutional:      General: He is not in acute distress. HENT:     Head: Normocephalic.  Eyes:     Pupils: Pupils are equal, round, and reactive to  light.  Cardiovascular:     Rate and Rhythm: Normal rate and regular rhythm.     Pulses: Normal pulses.     Heart sounds: Normal heart sounds. No murmur. No friction rub. No gallop.   Pulmonary:     Effort: Pulmonary effort is normal.     Breath sounds: Normal breath sounds.  Abdominal:     General: Abdomen is flat. There is no distension.     Palpations: Abdomen is soft.     Tenderness: There is no abdominal tenderness. There is no guarding or rebound.  Musculoskeletal:     Cervical back: Neck supple.     Comments: Tenderness palpation over right Achilles tendon and right calf.  No overlying erythema, edema, or warmth.  limited range of motion of right ankle.  Distal pulses and sensation intact. Soft compartments. Negative thompson test (but slightly decreased on right compared to left).  Skin:    General: Skin is warm and dry.  Neurological:     General: No focal deficit present.     Mental Status: He is alert.  Psychiatric:        Mood and Affect: Mood normal.        Behavior: Behavior normal.     ED Results / Procedures / Treatments   Labs (all labs ordered are listed, but only abnormal results are displayed) Labs Reviewed - No data to display  EKG None  Radiology DG Tibia/Fibula Right  Result Date: 09/22/2019 CLINICAL DATA:  Popping sensation while playing basketball. History of Achilles tendinitis. EXAM: RIGHT TIBIA AND FIBULA - 2 VIEW COMPARISON:  11/22/2006 ankle radiographs. FINDINGS: No acute fracture or dislocation. Enthesophytes at the quadriceps insertion and Achilles tendon insertion. No plain film evidence of Achilles tendon rupture. IMPRESSION: No acute osseous abnormality. Electronically Signed   By: Abigail Miyamoto M.D.   On: 09/22/2019 18:24    Procedures Procedures (including critical care time)  Medications Ordered in ED Medications  HYDROcodone-acetaminophen (NORCO/VICODIN) 5-325 MG per tablet 1 tablet (1 tablet Oral Given 09/22/19 1853)    ED Course   I have reviewed the triage vital signs and the nursing notes.  Pertinent labs & imaging results that were available during my care of the patient were reviewed by me and considered in my medical decision making (see chart for details).  Clinical Course as of Sep 21 1956  Sun Sep 22, 2019  1829 Spoke to Dr. Alvan Dame with orthopedics who recommended posterior splint in neutral position and non-weight bearing. Will have patient follow-up with Dr. Alvan Dame early this week.    [CA]    Clinical Course User Index [CA] Suzy Bouchard, PA-C   MDM Rules/Calculators/A&P  34 year old male presents to the ED due to sudden onset of right Achilles tendon and calf pain while running while playing basketball just prior to arrival.  Denies recent antibiotics.  Patient admits to a history of right Achilles tendinitis.  Vitals all within normal limits.  Patient is afebrile, not tachycardic or hypoxic.  Patient in no acute distress and non-ill-appearing. Tenderness palpation over right Achilles tendon and right calf.  No overlying erythema, edema, or warmth.  limited range of motion of right ankle.  Distal pulses and sensation intact. Soft compartments. No concern for compartment syndrome. Negative thompson test (but slightly decreased on right compared to left). Suspect tendonitis vs. Muscle strain vs. Partial achilles rupture. Will obtain x-ray to rule out bony fractures.   X-ray personally reviewed which is negative for bony fractures. Will consult orthopedics to ensure follow-up and to discuss recommended treatment. Discussed case with Dr. Alvan Dame. See note above. Will discharge patient with naproxen. Advised patient to call Dr. Aurea Graff office tomorrow to schedule an appointment. Strict ED precautions discussed with patient. Patient states understanding and agrees to plan. Patient discharged home in no acute distress and stable vitals. Discussed case with Dr. Wyvonnia Dusky who evaluated patient at bedside and  agrees with assessment and plan.   Final Clinical Impression(s) / ED Diagnoses Final diagnoses:  Achilles tendon pain    Rx / DC Orders ED Discharge Orders         Ordered    naproxen (NAPROSYN) 500 MG tablet  2 times daily     09/22/19 1837           Karie Kirks 09/22/19 1959    Ezequiel Essex, MD 09/23/19 0006

## 2019-09-22 NOTE — ED Notes (Signed)
An After Visit Summary was printed and given to the patient. Discharge instructions given and no further questions at this time.  

## 2019-09-22 NOTE — Discharge Instructions (Addendum)
As discussed, your x-ray is negative for broken bones. I suspect you either have a partial achilles rupture or tendinitis. I spoke to Dr. Alvan Dame with orthopedics who recommends non-weight bearing until you see him in office. I have included his number. Call tomorrow to schedule an appointment. I am sending you home with naproxen for pain. You can take twice a day as needed for pain. Do not mix with other over the counter medications. Return to the ER for new or worsening symptoms.

## 2019-09-22 NOTE — Progress Notes (Signed)
Orthopedic Tech Progress Note Patient Details:  MIN SARRA 1986-07-19 [id number]  Ortho Devices Type of Ortho Device: Short leg splint, Crutches Ortho Device/Splint Location: RLE Ortho Device/Splint Interventions: Ordered, Application   Post Interventions Patient Tolerated: Ambulated well, Well Instructions Provided: Poper ambulation with device, Care of device, Adjustment of device   Janit Pagan 09/22/2019, 8:01 PM

## 2019-09-22 NOTE — ED Notes (Signed)
Ortho tech bedside with patient.

## 2019-09-30 ENCOUNTER — Encounter (HOSPITAL_BASED_OUTPATIENT_CLINIC_OR_DEPARTMENT_OTHER): Payer: Self-pay | Admitting: Orthopedic Surgery

## 2019-09-30 ENCOUNTER — Other Ambulatory Visit: Payer: Self-pay

## 2019-10-01 ENCOUNTER — Other Ambulatory Visit (HOSPITAL_COMMUNITY)
Admission: RE | Admit: 2019-10-01 | Discharge: 2019-10-01 | Disposition: A | Discharge status: Home / Self Care | Source: Ambulatory Visit | Attending: Orthopedic Surgery | Admitting: Orthopedic Surgery

## 2019-10-01 DIAGNOSIS — Z20822 Contact with and (suspected) exposure to covid-19: Secondary | ICD-10-CM | POA: Diagnosis not present

## 2019-10-01 DIAGNOSIS — Z01812 Encounter for preprocedural laboratory examination: Secondary | ICD-10-CM | POA: Diagnosis present

## 2019-10-01 LAB — SARS CORONAVIRUS 2 (TAT 6-24 HRS): SARS Coronavirus 2: NEGATIVE

## 2019-10-01 NOTE — Progress Notes (Signed)

## 2019-10-02 ENCOUNTER — Other Ambulatory Visit (HOSPITAL_COMMUNITY): Payer: Self-pay | Admitting: Orthopedic Surgery

## 2019-10-03 ENCOUNTER — Other Ambulatory Visit: Payer: Self-pay

## 2019-10-03 ENCOUNTER — Ambulatory Visit (HOSPITAL_BASED_OUTPATIENT_CLINIC_OR_DEPARTMENT_OTHER)
Admission: RE | Admit: 2019-10-03 | Discharge: 2019-10-03 | Disposition: A | Discharge status: Home / Self Care | Attending: Orthopedic Surgery | Admitting: Orthopedic Surgery

## 2019-10-03 ENCOUNTER — Ambulatory Visit (HOSPITAL_BASED_OUTPATIENT_CLINIC_OR_DEPARTMENT_OTHER): Admitting: Anesthesiology

## 2019-10-03 ENCOUNTER — Encounter (HOSPITAL_BASED_OUTPATIENT_CLINIC_OR_DEPARTMENT_OTHER): Payer: Self-pay | Admitting: Orthopedic Surgery

## 2019-10-03 ENCOUNTER — Encounter (HOSPITAL_BASED_OUTPATIENT_CLINIC_OR_DEPARTMENT_OTHER)
Admission: RE | Disposition: A | Discharge status: Home / Self Care | Payer: Self-pay | Source: Home / Self Care | Attending: Orthopedic Surgery

## 2019-10-03 DIAGNOSIS — X58XXXA Exposure to other specified factors, initial encounter: Secondary | ICD-10-CM | POA: Diagnosis not present

## 2019-10-03 DIAGNOSIS — D573 Sickle-cell trait: Secondary | ICD-10-CM | POA: Insufficient documentation

## 2019-10-03 DIAGNOSIS — S86011A Strain of right Achilles tendon, initial encounter: Secondary | ICD-10-CM | POA: Diagnosis not present

## 2019-10-03 DIAGNOSIS — M25571 Pain in right ankle and joints of right foot: Secondary | ICD-10-CM | POA: Diagnosis present

## 2019-10-03 HISTORY — DX: Sickle-cell trait: D57.3

## 2019-10-03 HISTORY — PX: ACHILLES TENDON SURGERY: SHX542

## 2019-10-03 SURGERY — REPAIR, TENDON, ACHILLES
Anesthesia: Regional | Site: Foot | Laterality: Right

## 2019-10-03 MED ORDER — PROMETHAZINE HCL 25 MG/ML IJ SOLN: 6.2500 mg | INTRAMUSCULAR | Status: DC | PRN

## 2019-10-03 MED ORDER — CLONIDINE HCL (ANALGESIA) 100 MCG/ML EP SOLN
EPIDURAL | Status: DC | PRN
  Administered 2019-10-03: 100 ug

## 2019-10-03 MED ORDER — DEXAMETHASONE SODIUM PHOSPHATE 10 MG/ML IJ SOLN
INTRAMUSCULAR | Status: DC | PRN
  Administered 2019-10-03: 10 mg via INTRAVENOUS

## 2019-10-03 MED ORDER — KETOROLAC TROMETHAMINE 30 MG/ML IJ SOLN: 30.0000 mg | Freq: Once | INTRAMUSCULAR | Status: DC | PRN

## 2019-10-03 MED ORDER — CHLORHEXIDINE GLUCONATE 4 % EX LIQD: 60.0000 mL | Freq: Once | CUTANEOUS | Status: DC

## 2019-10-03 MED ORDER — MIDAZOLAM HCL 2 MG/2ML IJ SOLN
1.0000 mg | INTRAMUSCULAR | Status: DC | PRN
  Administered 2019-10-03: 2 mg via INTRAVENOUS

## 2019-10-03 MED ORDER — CEFAZOLIN SODIUM-DEXTROSE 2-4 GM/100ML-% IV SOLN
INTRAVENOUS | Status: AC
  Filled 2019-10-03: qty 100

## 2019-10-03 MED ORDER — FENTANYL CITRATE (PF) 100 MCG/2ML IJ SOLN
INTRAMUSCULAR | Status: DC | PRN
  Administered 2019-10-03 (×2): 50 ug via INTRAVENOUS

## 2019-10-03 MED ORDER — VANCOMYCIN HCL 500 MG IV SOLR
INTRAVENOUS | Status: DC | PRN
  Administered 2019-10-03: 500 mg via TOPICAL

## 2019-10-03 MED ORDER — OXYCODONE HCL 5 MG/5ML PO SOLN: 5.0000 mg | Freq: Once | ORAL | Status: DC | PRN

## 2019-10-03 MED ORDER — FENTANYL CITRATE (PF) 100 MCG/2ML IJ SOLN
50.0000 ug | INTRAMUSCULAR | Status: DC | PRN
  Administered 2019-10-03: 10:00:00 100 ug via INTRAVENOUS

## 2019-10-03 MED ORDER — LIDOCAINE 2% (20 MG/ML) 5 ML SYRINGE
INTRAMUSCULAR | Status: AC
  Filled 2019-10-03: qty 5

## 2019-10-03 MED ORDER — SENNA 8.6 MG PO TABS: 2.0000 | ORAL_TABLET | Freq: Two times a day (BID) | ORAL | 0 refills | Status: DC

## 2019-10-03 MED ORDER — ROCURONIUM BROMIDE 50 MG/5ML IV SOSY
PREFILLED_SYRINGE | INTRAVENOUS | Status: DC | PRN
  Administered 2019-10-03: 60 mg via INTRAVENOUS

## 2019-10-03 MED ORDER — CEFAZOLIN SODIUM-DEXTROSE 2-4 GM/100ML-% IV SOLN
2.0000 g | INTRAVENOUS | Status: AC
  Administered 2019-10-03: 2 g via INTRAVENOUS

## 2019-10-03 MED ORDER — 0.9 % SODIUM CHLORIDE (POUR BTL) OPTIME
TOPICAL | Status: DC | PRN
  Administered 2019-10-03: 200 mL

## 2019-10-03 MED ORDER — VANCOMYCIN HCL 1000 MG IV SOLR
INTRAVENOUS | Status: AC
  Filled 2019-10-03: qty 1000

## 2019-10-03 MED ORDER — OXYCODONE HCL 5 MG PO TABS: 5.0000 mg | ORAL_TABLET | ORAL | 0 refills | Status: AC | PRN

## 2019-10-03 MED ORDER — ONDANSETRON HCL 4 MG/2ML IJ SOLN
INTRAMUSCULAR | Status: DC | PRN
  Administered 2019-10-03: 4 mg via INTRAVENOUS

## 2019-10-03 MED ORDER — DOCUSATE SODIUM 100 MG PO CAPS: 100.0000 mg | ORAL_CAPSULE | Freq: Every day | ORAL | 2 refills | Status: AC | PRN

## 2019-10-03 MED ORDER — SUGAMMADEX SODIUM 200 MG/2ML IV SOLN
INTRAVENOUS | Status: DC | PRN
  Administered 2019-10-03: 200 mg via INTRAVENOUS

## 2019-10-03 MED ORDER — RIVAROXABAN 10 MG PO TABS: 10.0000 mg | ORAL_TABLET | Freq: Every day | ORAL | 0 refills | Status: DC

## 2019-10-03 MED ORDER — ROCURONIUM BROMIDE 10 MG/ML (PF) SYRINGE
PREFILLED_SYRINGE | INTRAVENOUS | Status: AC
  Filled 2019-10-03: qty 10

## 2019-10-03 MED ORDER — PROPOFOL 10 MG/ML IV BOLUS
INTRAVENOUS | Status: DC | PRN
  Administered 2019-10-03: 200 mg via INTRAVENOUS

## 2019-10-03 MED ORDER — ACETAMINOPHEN 500 MG PO TABS
1000.0000 mg | ORAL_TABLET | Freq: Once | ORAL | Status: AC
  Administered 2019-10-03: 1000 mg via ORAL

## 2019-10-03 MED ORDER — HYDROMORPHONE HCL 1 MG/ML IJ SOLN
0.2500 mg | INTRAMUSCULAR | Status: DC | PRN
  Administered 2019-10-03: 0.5 mg via INTRAVENOUS

## 2019-10-03 MED ORDER — FENTANYL CITRATE (PF) 100 MCG/2ML IJ SOLN
INTRAMUSCULAR | Status: AC
  Filled 2019-10-03: qty 2

## 2019-10-03 MED ORDER — SODIUM CHLORIDE 0.9 % IV SOLN: INTRAVENOUS | Status: DC

## 2019-10-03 MED ORDER — HYDROMORPHONE HCL 1 MG/ML IJ SOLN
INTRAMUSCULAR | Status: AC
  Filled 2019-10-03: qty 0.5

## 2019-10-03 MED ORDER — ROPIVACAINE HCL 5 MG/ML IJ SOLN
INTRAMUSCULAR | Status: DC | PRN
  Administered 2019-10-03: 30 mL via PERINEURAL

## 2019-10-03 MED ORDER — ONDANSETRON HCL 4 MG/2ML IJ SOLN
INTRAMUSCULAR | Status: AC
  Filled 2019-10-03: qty 2

## 2019-10-03 MED ORDER — DEXAMETHASONE SODIUM PHOSPHATE 10 MG/ML IJ SOLN
INTRAMUSCULAR | Status: AC
  Filled 2019-10-03: qty 1

## 2019-10-03 MED ORDER — ACETAMINOPHEN 500 MG PO TABS
ORAL_TABLET | ORAL | Status: AC
  Filled 2019-10-03: qty 2

## 2019-10-03 MED ORDER — MIDAZOLAM HCL 2 MG/2ML IJ SOLN
INTRAMUSCULAR | Status: AC
  Filled 2019-10-03: qty 2

## 2019-10-03 MED ORDER — ASPIRIN EC 81 MG PO TBEC: 81.0000 mg | DELAYED_RELEASE_TABLET | Freq: Two times a day (BID) | ORAL | 0 refills | Status: DC

## 2019-10-03 MED ORDER — LIDOCAINE 2% (20 MG/ML) 5 ML SYRINGE
INTRAMUSCULAR | Status: DC | PRN
  Administered 2019-10-03: 80 mg via INTRAVENOUS

## 2019-10-03 MED ORDER — OXYCODONE HCL 5 MG PO TABS: 5.0000 mg | ORAL_TABLET | Freq: Once | ORAL | Status: DC | PRN

## 2019-10-03 MED ORDER — BUPIVACAINE HCL (PF) 0.5 % IJ SOLN
INTRAMUSCULAR | Status: AC
  Filled 2019-10-03: qty 30

## 2019-10-03 MED ORDER — LACTATED RINGERS IV SOLN: INTRAVENOUS | Status: DC

## 2019-10-03 SURGICAL SUPPLY — 76 items
BANDAGE ESMARK 6X9 LF (GAUZE/BANDAGES/DRESSINGS) ×1 IMPLANT
BLADE AVERAGE 25MMX9MM (BLADE)
BLADE AVERAGE 25X9 (BLADE) IMPLANT
BLADE MICRO SAGITTAL (BLADE) IMPLANT
BLADE SURG 15 STRL LF DISP TIS (BLADE) ×2 IMPLANT
BLADE SURG 15 STRL SS (BLADE) ×6
BNDG COHESIVE 4X5 TAN STRL (GAUZE/BANDAGES/DRESSINGS) ×3 IMPLANT
BNDG COHESIVE 6X5 TAN STRL LF (GAUZE/BANDAGES/DRESSINGS) ×3 IMPLANT
BNDG ESMARK 6X9 LF (GAUZE/BANDAGES/DRESSINGS) ×3
CANISTER SUCT 1200ML W/VALVE (MISCELLANEOUS) ×3 IMPLANT
CHLORAPREP W/TINT 26 (MISCELLANEOUS) ×3 IMPLANT
COVER BACK TABLE 60X90IN (DRAPES) ×3 IMPLANT
COVER WAND RF STERILE (DRAPES) IMPLANT
CUFF TOURN SGL QUICK 34 (TOURNIQUET CUFF) ×3
CUFF TRNQT CYL 34X4.125X (TOURNIQUET CUFF) ×1 IMPLANT
DRAPE EXTREMITY T 121X128X90 (DISPOSABLE) ×3 IMPLANT
DRAPE OEC MINIVIEW 54X84 (DRAPES) IMPLANT
DRAPE U-SHAPE 47X51 STRL (DRAPES) ×3 IMPLANT
DRSG MEPITEL 4X7.2 (GAUZE/BANDAGES/DRESSINGS) ×3 IMPLANT
DRSG PAD ABDOMINAL 8X10 ST (GAUZE/BANDAGES/DRESSINGS) ×6 IMPLANT
ELECT REM PT RETURN 9FT ADLT (ELECTROSURGICAL) ×3
ELECTRODE REM PT RTRN 9FT ADLT (ELECTROSURGICAL) ×1 IMPLANT
GAUZE SPONGE 4X4 12PLY STRL (GAUZE/BANDAGES/DRESSINGS) ×3 IMPLANT
GLOVE BIO SURGEON STRL SZ7 (GLOVE) ×3 IMPLANT
GLOVE BIO SURGEON STRL SZ8 (GLOVE) ×3 IMPLANT
GLOVE BIOGEL PI IND STRL 6.5 (GLOVE) ×2 IMPLANT
GLOVE BIOGEL PI IND STRL 7.0 (GLOVE) ×1 IMPLANT
GLOVE BIOGEL PI IND STRL 7.5 (GLOVE) ×1 IMPLANT
GLOVE BIOGEL PI IND STRL 8 (GLOVE) ×3 IMPLANT
GLOVE BIOGEL PI INDICATOR 6.5 (GLOVE) ×4
GLOVE BIOGEL PI INDICATOR 7.0 (GLOVE) ×2
GLOVE BIOGEL PI INDICATOR 7.5 (GLOVE) ×2
GLOVE BIOGEL PI INDICATOR 8 (GLOVE) ×6
GLOVE ECLIPSE 6.5 STRL STRAW (GLOVE) ×6 IMPLANT
GLOVE ECLIPSE 8.0 STRL XLNG CF (GLOVE) ×3 IMPLANT
GOWN STRL REUS W/ TWL LRG LVL3 (GOWN DISPOSABLE) ×3 IMPLANT
GOWN STRL REUS W/ TWL XL LVL3 (GOWN DISPOSABLE) ×2 IMPLANT
GOWN STRL REUS W/TWL LRG LVL3 (GOWN DISPOSABLE) ×9
GOWN STRL REUS W/TWL XL LVL3 (GOWN DISPOSABLE) ×6
NDL SUT 6 .5 CRC .975X.05 MAYO (NEEDLE) IMPLANT
NEEDLE HYPO 25X1 1.5 SAFETY (NEEDLE) IMPLANT
NEEDLE MAYO TAPER (NEEDLE)
PACK BASIN DAY SURGERY FS (CUSTOM PROCEDURE TRAY) ×3 IMPLANT
PAD CAST 4YDX4 CTTN HI CHSV (CAST SUPPLIES) ×1 IMPLANT
PADDING CAST ABS 4INX4YD NS (CAST SUPPLIES)
PADDING CAST ABS COTTON 4X4 ST (CAST SUPPLIES) IMPLANT
PADDING CAST COTTON 4X4 STRL (CAST SUPPLIES) ×3
PADDING CAST COTTON 6X4 STRL (CAST SUPPLIES) ×3 IMPLANT
PENCIL SMOKE EVACUATOR (MISCELLANEOUS) ×3 IMPLANT
SANITIZER HAND PURELL 535ML FO (MISCELLANEOUS) ×3 IMPLANT
SHEET MEDIUM DRAPE 40X70 STRL (DRAPES) ×3 IMPLANT
SLEEVE SCD COMPRESS KNEE MED (MISCELLANEOUS) ×3 IMPLANT
SPLINT FAST PLASTER 5X30 (CAST SUPPLIES) ×40
SPLINT PLASTER CAST FAST 5X30 (CAST SUPPLIES) ×20 IMPLANT
SPONGE LAP 18X18 RF (DISPOSABLE) ×3 IMPLANT
STOCKINETTE 6  STRL (DRAPES) ×3
STOCKINETTE 6 STRL (DRAPES) ×1 IMPLANT
SUCTION FRAZIER HANDLE 10FR (MISCELLANEOUS) ×3
SUCTION TUBE FRAZIER 10FR DISP (MISCELLANEOUS) ×1 IMPLANT
SUT ETHILON 3 0 PS 1 (SUTURE) ×3 IMPLANT
SUT FIBERWIRE #2 38 T-5 BLUE (SUTURE)
SUT MNCRL AB 3-0 PS2 18 (SUTURE) ×3 IMPLANT
SUT VIC AB 0 SH 27 (SUTURE) ×6 IMPLANT
SUT VIC AB 1 CT1 27 (SUTURE) ×12
SUT VIC AB 1 CT1 27XBRD ANBCTR (SUTURE) ×4 IMPLANT
SUT VIC AB 2-0 SH 27 (SUTURE) ×3
SUT VIC AB 2-0 SH 27XBRD (SUTURE) ×1 IMPLANT
SUTURE FIBERWR #2 38 T-5 BLUE (SUTURE) IMPLANT
SUTURE TAPE 1.3 FIBERLOP 20 ST (SUTURE) IMPLANT
SUTURETAPE 1.3 FIBERLOOP 20 ST (SUTURE)
SYR BULB 3OZ (MISCELLANEOUS) ×3 IMPLANT
SYR CONTROL 10ML LL (SYRINGE) IMPLANT
TOWEL GREEN STERILE FF (TOWEL DISPOSABLE) ×3 IMPLANT
TUBE CONNECTING 20'X1/4 (TUBING) ×1
TUBE CONNECTING 20X1/4 (TUBING) ×2 IMPLANT
UNDERPAD 30X36 HEAVY ABSORB (UNDERPADS AND DIAPERS) ×3 IMPLANT

## 2019-10-03 NOTE — Op Note (Signed)
10/03/2019  12:18 PM  PATIENT:  Adam Prince  34 y.o. male  PRE-OPERATIVE DIAGNOSIS:  Right Achilles tendon rupture  POST-OPERATIVE DIAGNOSIS:  Right Achilles tendon rupture  Procedure(s):  Right ACHILLES TENDON REPAIR  SURGEON:  Wylene Simmer, MD  ASSISTANT: Mechele Claude, PA-C  ANESTHESIA:   General, regional  EBL:  minimal   TOURNIQUET:   Total Tourniquet Time Documented: Thigh (Right) - 43 minutes Total: Thigh (Right) - 43 minutes  COMPLICATIONS:  None apparent  DISPOSITION:  Extubated, awake and stable to recovery.  INDICATION FOR PROCEDURE: The patient is a 34 year old male without significant past medical history.  He was playing basketball last week when he pushed off to drive to the basket.  He felt a pop in the ankle and was unable to continue playing.  He was seen in the office and an MRI ordered.  He followed up with me afterwards.  Imaging and physical exam findings were consistent with an Achilles tendon rupture.  He presents now for operative treatment of this injury.  The risks and benefits of the alternative treatment options have been discussed in detail.  The patient wishes to proceed with surgery and specifically understands risks of bleeding, infection, nerve damage, blood clots, need for additional surgery, amputation and death.  PROCEDURE IN DETAIL: After preoperative consent was obtained and the correct operative site was identified, the patient was brought to the operating room supine on a stretcher.  Preoperative antibiotics were administered.  General anesthesia was administered.  A surgical timeout was taken.  The right lower extremity was exsanguinated and a thigh tourniquet inflated to 250 mmHg.  The patient was then turned in the prone position on the operating table with all bony prominences padded well.  The right lower extremity was prepped and draped in standard sterile fashion.  A longitudinal incision was then made over the palpable defect in the  tendon.  The incision was carried down through the subcutaneous tissues and the peritenon exposing the tendon rupture.  The ends of the tendon were debrided.  The deep fascia was incised and released proximally and distally exposing the FHL muscle.  The wound was irrigated.  #1 Vicryl Bennell sutures were placed in either tendon in leaving 4 strands of suture at each end.  The tendon was then repaired by tying the sutures in pair wise fashion.  The tendon was repaired with the ankle in maximum plantarflexion.  A 3-0 Monocryl running Silfverskiold suture was then placed circumferentially around the tendon repair.  The wound was irrigated.  Vancomycin powder was sprinkled in the wound.  The peritenon was repaired with inverted simple sutures of 2-0 Vicryl.  The skin incision was closed with horizontal mattress sutures of 3-0 nylon.  Sterile dressings were applied followed by a well-padded short leg splint.  The tourniquet was released after application of the dressings.  The patient was awakened from anesthesia and transported to the recovery room in stable condition.   FOLLOW UP PLAN: Nonweightbearing on the right lower extremity.  Xarelto 10 mg p.o. daily starting postop day 1 for 2 weeks due to his history of sickle cell trait.  Follow-up in the office in 2 weeks for suture removal and conversion to a cam boot with 2 heel lifts.    Mechele Claude PA-C was present and scrubbed for the duration of the operative case. His assistance was essential in positioning the patient, prepping and draping, gaining and maintaining exposure, performing the operation, closing and dressing the wounds and  applying the splint.

## 2019-10-03 NOTE — Anesthesia Postprocedure Evaluation (Signed)
Anesthesia Post Note  Patient: Adam Prince  Procedure(s) Performed: ACHILLES TENDON REPAIR (Right Foot)     Patient location during evaluation: PACU Anesthesia Type: Regional and General Level of consciousness: awake and alert Pain management: pain level controlled Vital Signs Assessment: post-procedure vital signs reviewed and stable Respiratory status: spontaneous breathing, nonlabored ventilation, respiratory function stable and patient connected to nasal cannula oxygen Cardiovascular status: blood pressure returned to baseline and stable Postop Assessment: no apparent nausea or vomiting Anesthetic complications: no    Last Vitals:  Vitals:   10/03/19 1330 10/03/19 1515  BP: 99/76 120/80  Pulse: 70 91  Resp: (!) 24 18  Temp:  36.7 C  SpO2: 97% 100%    Last Pain:  Vitals:   10/03/19 1515  TempSrc:   PainSc: 2                  Mordecai Tindol P Wonda Goodgame

## 2019-10-03 NOTE — Anesthesia Procedure Notes (Signed)
Anesthesia Regional Block: Popliteal block   Pre-Anesthetic Checklist: ,, timeout performed, Correct Patient, Correct Site, Correct Laterality, Correct Procedure, Correct Position, site marked, Risks and benefits discussed,  Surgical consent,  Pre-op evaluation,  At surgeon's request and post-op pain management  Laterality: Right  Prep: chloraprep       Needles:  Injection technique: Single-shot  Needle Type: Echogenic Stimulator Needle     Needle Length: 10cm  Needle Gauge: 21     Additional Needles:   Procedures:,,,, ultrasound used (permanent image in chart),,,,  Narrative:  Start time: 10/03/2019 10:00 AM End time: 10/03/2019 10:10 AM Injection made incrementally with aspirations every 5 mL.  Performed by: Personally  Anesthesiologist: Murvin Natal, MD  Additional Notes: Functioning IV was confirmed and monitors were applied.  A timeout was performed. Sterile prep, hand hygiene and sterile gloves were used. A 184mm 21ga Pajunk echogenic stimulator needle was used. Negative aspiration and negative test dose prior to incremental administration of local anesthetic. The patient tolerated the procedure well.  Ultrasound guidance: relevent anatomy identified, needle position confirmed, local anesthetic spread visualized around nerve(s), vascular puncture avoided.  Image printed for medical record.

## 2019-10-03 NOTE — Transfer of Care (Signed)
Immediate Anesthesia Transfer of Care Note  Patient: Adam Prince  Procedure(s) Performed: ACHILLES TENDON REPAIR (Right Foot)  Patient Location: PACU  Anesthesia Type:GA combined with regional for post-op pain  Level of Consciousness: awake, alert  and oriented  Airway & Oxygen Therapy: Patient Spontanous Breathing and Patient connected to face mask oxygen  Post-op Assessment: Report given to RN and Post -op Vital signs reviewed and stable  Post vital signs: Reviewed and stable  Last Vitals:  Vitals Value Taken Time  BP 129/112 10/03/19 1220  Temp    Pulse 80 10/03/19 1226  Resp 12 10/03/19 1226  SpO2 100 % 10/03/19 1226  Vitals shown include unvalidated device data.  Last Pain:  Vitals:   10/03/19 0901  TempSrc: Oral  PainSc: 0-No pain         Complications: No apparent anesthesia complications

## 2019-10-03 NOTE — H&P (Signed)
Adam Prince is an 34 y.o. male.   Chief Complaint: Right ankle pain HPI: The patient is a 34 year old male without significant past medical history.  He injured his right ankle playing basketball over a week ago.  He felt a pop in the back of his ankle.  He was seen in the office and an MRI ordered.  The MRI and physical exam findings are consistent with an acute Achilles tendon rupture.  He presents now for operative treatment of this injury.  Past Medical History:  Diagnosis Date  . Achilles tendonitis 2016   right leg  . Sickle cell trait [facility])     Past Surgical History:  Procedure Laterality Date  . CHOLECYSTECTOMY    . CHOLECYSTECTOMY  01/2019  . KNEE SURGERY    . TYMPANOSTOMY TUBE PLACEMENT      Family History  Problem Relation Age of Onset  . Atrial fibrillation Mother   . Asthma Brother    Social History:  reports that he has never smoked. He has never used smokeless tobacco. He reports current alcohol use. He reports that he does not use drugs.  Allergies:  Allergies  Allergen Reactions  . Sulfa Antibiotics Other (See Comments)    Childhood reaction    Medications Prior to Admission  Medication Sig Dispense Refill  . ibuprofen (ADVIL) 200 MG tablet Take 200 mg by mouth every 6 (six) hours as needed.    Marland Kitchen acetaminophen (TYLENOL) 500 MG tablet Take 2 tablets (1,000 mg total) by mouth every 8 (eight) hours as needed for fever. 30 tablet 0  . traMADol (ULTRAM) 50 MG tablet Take by mouth every 6 (six) hours as needed for moderate pain.      No results found for this or any previous visit (from the past 48 hour(s)). No results found.  Review of Systems no recent fever, chills, nausea, vomiting or changes in his appetite  Blood pressure (!) 144/86, pulse 81, temperature (!) 97.3 F (36.3 C), temperature source Oral, resp. rate (!) 21, height 5\' 6"  (1.676 m), weight 97.8 kg, SpO2 100 %. Physical Exam  Well-nourished well-developed young man in no apparent  distress.  Alert and oriented x4.  Mood and affect are normal.  Extraocular motions are intact.  Respirations are unlabored.  Gait is nonweightbearing on the right.  The right ankle has a palpable defect at the Achilles tendon several centimeters from its insertion on the calcaneus.  Skin is healthy and intact.  Positive Thompson test.  4-5 strength in plantarflexion.  No lymphadenopathy.  Pulses are palpable in the foot.  Normal sensibility to light touch in the sural nerve distribution.  Assessment/Plan Right Achilles tendon rupture -to the operating room today for right Achilles tendon repair.  The risks and benefits of the alternative treatment options have been discussed in detail.  The patient wishes to proceed with surgery and specifically understands risks of bleeding, infection, nerve damage, blood clots, need for additional surgery, amputation and death.   Adam Simmer, MD October 10, 2019, 11:00 AM

## 2019-10-03 NOTE — Progress Notes (Signed)
Assisted Dr. Ellender with right, ultrasound guided, popliteal block. Side rails up, monitors on throughout procedure. See vital signs in flow sheet. Tolerated Procedure well. ?

## 2019-10-03 NOTE — Anesthesia Procedure Notes (Signed)
Procedure Name: Intubation Date/Time: 10/03/2019 11:19 AM Performed by: Genelle Bal, CRNA Pre-anesthesia Checklist: Patient identified, Emergency Drugs available, Suction available and Patient being monitored Patient Re-evaluated:Patient Re-evaluated prior to induction Oxygen Delivery Method: Circle system utilized Preoxygenation: Pre-oxygenation with 100% oxygen Induction Type: IV induction Ventilation: Mask ventilation without difficulty and Oral airway inserted - appropriate to patient size Laryngoscope Size: Sabra Heck and 2 Grade View: Grade I Tube type: Oral Tube size: 8.0 mm Number of attempts: 1 Airway Equipment and Method: Stylet and Oral airway Placement Confirmation: ETT inserted through vocal cords under direct vision,  positive ETCO2 and breath sounds checked- equal and bilateral Secured at: 21 cm Tube secured with: Tape Dental Injury: Teeth and Oropharynx as per pre-operative assessment

## 2019-10-03 NOTE — Anesthesia Preprocedure Evaluation (Addendum)
Anesthesia Evaluation  Patient identified by MRN, date of birth, ID band Patient awake    Reviewed: Allergy & Precautions, NPO status , Patient's Chart, lab work & pertinent test results  Airway Mallampati: II  TM Distance: >3 FB Neck ROM: Full    Dental no notable dental hx.    Pulmonary neg pulmonary ROS,    Pulmonary exam normal breath sounds clear to auscultation       Cardiovascular negative cardio ROS Normal cardiovascular exam Rhythm:Regular Rate:Normal     Neuro/Psych negative neurological ROS  negative psych ROS   GI/Hepatic negative GI ROS, Neg liver ROS,   Endo/Other  negative endocrine ROS  Renal/GU negative Renal ROS     Musculoskeletal negative musculoskeletal ROS (+)   Abdominal (+) + obese,   Peds  Hematology negative hematology ROS (+)   Anesthesia Other Findings Right Achilles tendon rupture  Reproductive/Obstetrics                            Anesthesia Physical Anesthesia Plan  ASA: II  Anesthesia Plan: General and Regional   Post-op Pain Management: GA combined w/ Regional for post-op pain   Induction: Intravenous  PONV Risk Score and Plan: 2 and Ondansetron, Dexamethasone, Midazolam and Treatment may vary due to age or medical condition  Airway Management Planned: Oral ETT  Additional Equipment:   Intra-op Plan:   Post-operative Plan: Extubation in OR  Informed Consent: I have reviewed the patients History and Physical, chart, labs and discussed the procedure including the risks, benefits and alternatives for the proposed anesthesia with the patient or authorized representative who has indicated his/her understanding and acceptance.     Dental advisory given  Plan Discussed with: CRNA  Anesthesia Plan Comments:        Anesthesia Quick Evaluation

## 2019-10-03 NOTE — Discharge Instructions (Addendum)
Adam Simmer, MD [facility]  Please read the following information regarding your care after surgery.  Medications  You only need a prescription for the narcotic pain medicine (ex. oxycodone, Percocet, Norco).  All of the other medicines listed below are available over the counter. X Aleve 2 pills twice a day for the first 3 days after surgery. X acetominophen (Tylenol) 650 mg every 4-6 hours as you need for minor to moderate pain X oxycodone as prescribed for severe pain  Narcotic pain medicine (ex. oxycodone, Percocet, Vicodin) will cause constipation.  To prevent this problem, take the following medicines while you are taking any pain medicine. X docusate sodium (Colace) 100 mg twice a day X senna (Senokot) 2 tablets twice a day  X To help prevent blood clots, take Xarelto once a day for two weeks.  Then take a baby aspirin (81 mg) twice a day for the following month.  You should also get up every hour while you are awake to move around.    Weight Bearing X Do not bear any weight on the operated leg or foot.  Cast / Splint / Dressing X Keep your splint, cast or dressing clean and dry.  Don't put anything (coat hanger, pencil, etc) down inside of it.  If it gets damp, use a hair dryer on the cool setting to dry it.  If it gets soaked, call the office to schedule an appointment for a cast change.  After your dressing, cast or splint is removed; you may shower, but do not soak or scrub the wound.  Allow the water to run over it, and then gently pat it dry.  Swelling It is normal for you to have swelling where you had surgery.  To reduce swelling and pain, keep your toes above your nose for at least 3 days after surgery.  It may be necessary to keep your foot or leg elevated for several weeks.  If it hurts, it should be elevated.  Follow Up Call my office at [phone] when you are discharged from the hospital or surgery center to schedule an appointment to be seen two weeks after  surgery.  Call my office at [phone] if you develop a fever >101.5 F, nausea, vomiting, bleeding from the surgical site or severe pain.      Post Anesthesia Home Care Instructions  Activity: Get plenty of rest for the remainder of the day. A responsible individual must stay with you for 24 hours following the procedure.  For the next 24 hours, DO NOT: -Drive a car -Paediatric nurse -Drink alcoholic beverages -Take any medication unless instructed by your physician -Make any legal decisions or sign important papers.  Meals: Start with liquid foods such as gelatin or soup. Progress to regular foods as tolerated. Avoid greasy, spicy, heavy foods. If nausea and/or vomiting occur, drink only clear liquids until the nausea and/or vomiting subsides. Call your physician if vomiting continues.  Special Instructions/Symptoms: Your throat may feel dry or sore from the anesthesia or the breathing tube placed in your throat during surgery. If this causes discomfort, gargle with warm salt water. The discomfort should disappear within 24 hours.  If you had a scopolamine patch placed behind your ear for the management of post- operative nausea and/or vomiting:  1. The medication in the patch is effective for 72 hours, after which it should be removed.  Wrap patch in a tissue and discard in the trash. Wash hands thoroughly with soap and water. 2. You may remove  the patch earlier than 72 hours if you experience unpleasant side effects which may include dry mouth, dizziness or visual disturbances. 3. Avoid touching the patch. Wash your hands with soap and water after contact with the patch.

## 2019-10-04 ENCOUNTER — Encounter: Payer: Self-pay | Admitting: *Deleted

## 2020-03-10 ENCOUNTER — Emergency Department (HOSPITAL_COMMUNITY): Admission: EM | Admit: 2020-03-10 | Discharge: 2020-03-10 | Discharge status: Against Medical Advice

## 2020-03-10 NOTE — ED Notes (Signed)
Eloped prior to triage.

## 2021-12-03 ENCOUNTER — Ambulatory Visit
Admission: EM | Admit: 2021-12-03 | Discharge: 2021-12-03 | Disposition: A | Discharge status: Home / Self Care | Payer: No Typology Code available for payment source | Attending: Family Medicine | Admitting: Family Medicine

## 2021-12-03 DIAGNOSIS — K529 Noninfective gastroenteritis and colitis, unspecified: Secondary | ICD-10-CM

## 2021-12-03 MED ORDER — ONDANSETRON 4 MG PO TBDP: 4.0000 mg | ORAL_TABLET | Freq: Three times a day (TID) | ORAL | 0 refills | Status: AC | PRN

## 2021-12-03 NOTE — Discharge Instructions (Addendum)
Ondansetron dissolved in the mouth every 8 hours as needed for nausea or vomiting. ?Clear liquids and bland things to eat.  ? ?Go to the ER if you continue to vomit and cannot keep fluids down. ?

## 2021-12-03 NOTE — ED Provider Notes (Signed)
?[facility] ? ? ? ?CSN: [id number] ?Arrival date & time: 12/03/21  0820 ? ? ?  ? ?History   ?Chief Complaint ?Chief Complaint  ?Patient presents with  ? Abdominal Pain  ? ? ?HPI ?Adam Prince is a 36 y.o. male.  ? ? ?Abdominal Pain ?Here with nausea and vomiting, diarrhea, and abdominal cramps since last evening.  He has had about 6 episodes of emesis antibiotic many times of diarrhea also.  No blood in any output.  She had temperature to. ? ?Denies current medical problems or medications ? ?Past Medical History:  ?Diagnosis Date  ? Achilles tendonitis 2016  ? right leg  ? Sickle cell trait ([place])   ? ? ?Patient Active Problem List  ? Diagnosis Date Noted  ? Sleep disturbance 10/06/2015  ? 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 Elevated serum creatinine 10/06/2015  ? ? ?Past Surgical History:  ?Procedure Laterality Date  ? ACHILLES TENDON SURGERY Right 10/03/2019  ? Procedure: ACHILLES TENDON REPAIR;  Surgeon: Wylene Simmer, MD;  Location: [place];  Service: Orthopedics;  Laterality: Right;  ? CHOLECYSTECTOMY    ? CHOLECYSTECTOMY  01/2019  ? KNEE SURGERY    ? TYMPANOSTOMY TUBE PLACEMENT    ? ? ? ? ? ?Home Medications   ? ?Prior to Admission medications   ?Medication Sig Start Date End Date Taking? Authorizing Provider  ?ondansetron (ZOFRAN-ODT) 4 MG disintegrating tablet Take 1 tablet (4 mg total) by mouth every 8 (eight) hours as needed for nausea or vomiting. 12/03/21  Yes Lashan Macias, Gwenlyn Perking, MD  ?acetaminophen (TYLENOL) 500 MG tablet Take 2 tablets (1,000 mg total) by mouth every 8 (eight) hours as needed for fever. 10/13/17   Antonietta Breach, PA-C  ? ? ?Family History ?Family History  ?Problem Relation Age of Onset  ? Atrial fibrillation Mother   ? Asthma Brother   ? ? ?Social History ?Social History  ? ?Tobacco Use  ? Smoking status: Never  ? Smokeless tobacco: Never  ?Vaping Use  ? Vaping Use: Never used  ?Substance Use Topics  ? Alcohol use: Yes  ?  Comment: social  ? Drug use: No   ? ? ? ?Allergies   ?Sulfa antibiotics ? ? ?Review of Systems ?Review of Systems  ?Gastrointestinal:  Positive for abdominal pain.  ? ? ?Physical Exam ?Triage Vital Signs ?ED Triage Vitals  ?Enc Vitals Group  ?   BP 12/03/21 0839 123/89  ?   Pulse Rate 12/03/21 0839 (!) 105  ?   Resp 12/03/21 0839 18  ?   Temp 12/03/21 0839 99 ?F (37.2 ?C)  ?   Temp Source 12/03/21 0839 Oral  ?   SpO2 12/03/21 0839 95 %  ?   Weight --   ?   Height --   ?   Head Circumference --   ?   Peak Flow --   ?   Pain Score 12/03/21 0840 0  ?   Pain Loc --   ?   Pain Edu? --   ?   Excl. in [place]? --   ? ?No data found. ? ?Updated Vital Signs ?BP 123/89 (BP Location: Left Arm)   Pulse (!) 105   Temp 99 ?F (37.2 ?C) (Oral)   Resp 18   SpO2 95%  ? ?Visual Acuity ?Right Eye Distance:   ?Left Eye Distance:   ?Bilateral Distance:   ? ?Right Eye Near:   ?Left Eye Near:    ?Bilateral Near:    ? ?  Physical Exam ?Vitals reviewed.  ?Constitutional:   ?   General: He is not in acute distress. ?   Appearance: He is normal weight. He is not ill-appearing, toxic-appearing or diaphoretic.  ?HENT:  ?   Mouth/Throat:  ?   Mouth: Mucous membranes are moist.  ?   Pharynx: No oropharyngeal exudate.  ?Eyes:  ?   Extraocular Movements: Extraocular movements intact.  ?   Conjunctiva/sclera: Conjunctivae normal.  ?   Pupils: Pupils are equal, round, and reactive to light.  ?Cardiovascular:  ?   Rate and Rhythm: Normal rate and regular rhythm.  ?   Heart sounds: No murmur heard. ?Pulmonary:  ?   Effort: Pulmonary effort is normal.  ?   Breath sounds: Normal breath sounds.  ?Abdominal:  ?   General: There is no distension.  ?   Palpations: Abdomen is soft.  ?   Tenderness: There is no abdominal tenderness. There is no guarding.  ?   Comments: BS present, but hypoactive  ?Musculoskeletal:  ?   Cervical back: Neck supple.  ?Lymphadenopathy:  ?   Cervical: No cervical adenopathy.  ?Skin: ?   Capillary Refill: Capillary refill takes less than 2 seconds.  ?   Coloration:  Skin is not jaundiced or pale.  ?Neurological:  ?   General: No focal deficit present.  ?   Mental Status: He is alert and oriented to person, place, and time.  ?Psychiatric:     ?   Behavior: Behavior normal.  ? ? ? ?UC Treatments / Results  ?Labs ?(all labs ordered are listed, but only abnormal results are displayed) ?Labs Reviewed - No data to display ? ?EKG ? ? ?Radiology ?No results found. ? ?Procedures ?Procedures (including critical care time) ? ?Medications Ordered in UC ?Medications - No data to display ? ?Initial Impression / Assessment and Plan / UC Course  ?I have reviewed the triage vital signs and the nursing notes. ? ?Pertinent labs & imaging results that were available during my care of the patient were reviewed by me and considered in my medical decision making (see chart for details). ? ?  ? ?Will treat for gastroenteritis, with warnings for going to ER if not keeping fluids down. ?Final Clinical Impressions(s) / UC Diagnoses  ? ?Final diagnoses:  ?Gastroenteritis  ? ? ? ?Discharge Instructions   ? ?  ?Ondansetron dissolved in the mouth every 8 hours as needed for nausea or vomiting. ?Clear liquids and bland things to eat.  ? ?Go to the ER if you continue to vomit and cannot keep fluids down. ? ? ? ? ?ED Prescriptions   ? ? Medication Sig Dispense Auth. Provider  ? ondansetron (ZOFRAN-ODT) 4 MG disintegrating tablet Take 1 tablet (4 mg total) by mouth every 8 (eight) hours as needed for nausea or vomiting. 10 tablet Barrett Henle, MD  ? ?  ? ?PDMP not reviewed this encounter. ?  ?Barrett Henle, MD ?12/03/21 [phone] ? ?

## 2021-12-03 NOTE — ED Triage Notes (Signed)
Pt c/o abd pain, abd cramps, nausea and vomiting. States this happened a few hours after eating cinnamon roll yesterday ~ 5pm. Associated headache, and fever at home. States tried otc pain relief and pepto bismol at home without relief.  ?

## 2024-02-21 ENCOUNTER — Telehealth: Payer: Self-pay

## 2024-02-21 NOTE — Telephone Encounter (Signed)
 Called patient to inform him that is will considered a new patient to Dr.Sanders and she is not accepting NP right now but he could see a NP.

## 2024-04-09 ENCOUNTER — Encounter (HOSPITAL_BASED_OUTPATIENT_CLINIC_OR_DEPARTMENT_OTHER): Payer: Self-pay

## 2024-04-09 ENCOUNTER — Ambulatory Visit (HOSPITAL_BASED_OUTPATIENT_CLINIC_OR_DEPARTMENT_OTHER)

## 2024-06-03 ENCOUNTER — Ambulatory Visit (INDEPENDENT_AMBULATORY_CARE_PROVIDER_SITE_OTHER): Admitting: Primary Care

## 2024-06-03 ENCOUNTER — Encounter: Payer: Self-pay | Admitting: Primary Care

## 2024-06-03 VITALS — BP 128/74 | HR 94 | Temp 97.0°F | Ht 66.0 in | Wt 227.8 lb

## 2024-06-03 DIAGNOSIS — G4733 Obstructive sleep apnea (adult) (pediatric): Secondary | ICD-10-CM

## 2024-06-03 NOTE — Progress Notes (Signed)
 @Patient  ID: Adam Prince, male    DOB: 1985-11-17, 38 y.o.   MRN: [id number]  No chief complaint on file.   Referring provider: Celestia Harder, NP  HPI: 38 year old male, never smoked. PMH significant for thrombocytopenia.    06/03/2024 Discussed the use of AI scribe software for clinical note transcription with the patient, who gave verbal consent to proceed.  History of Present Illness Adam Prince is a 38 year old male with sleep apnea who presents for a sleep consult.  He has a history of sleep apnea diagnosed in November of the previous year while overseas with the [employer] in [place]. A sleep study conducted by the pulmonary department there revealed that he stopped breathing approximately twenty times during the night, with the longest episode lasting about a minute. Following this, he was prescribed a CPAP machine, which he has been using since then.  Since returning to the [place]  in August, he has not been seen by any medical professionals in the country regarding his sleep apnea until now. He reports issues with the CPAP machine, particularly discomfort with the full face mask. While the machine is comfortable when he is awake or trying to fall asleep, he reports it becomes difficult to breathe once he is asleep. He has not been receiving CPAP supplies and has lost some parts of the headgear during travel, which may be affecting the fit and effectiveness of the mask.  He notes that when he uses the CPAP, he wakes up with more energy. However, he does not use it consistently due to discomfort, stating 'I feel like I can't breathe' when using the machine during sleep. He describes himself as a mouth breather, which may influence the type of mask that would be most effective for him.  His typical bedtime is around 10 PM, and he starts his day at 5:30 AM. Without the CPAP, he wakes up three to four times a night. He has gained approximately thirty pounds since his time  overseas, now weighing over 200 pounds, compared to 198 pounds previously. He reports low daytime sleepiness with an upper score of four. He only snores when extremely tired, and his initial sleep study was prompted by others noticing he stopped breathing during sleep.  If can't get sleep study results needs HST   Allergies  Allergen Reactions   Sulfa Antibiotics Other (See Comments)    Childhood reaction    Immunization History  Administered Date(s) Administered   Hepatitis A 08/11/2010, 06/09/2011   Hepatitis B 08/11/2010   Hpv-Unspecified 06/11/2011   IPV 08/06/2010   Influenza-Unspecified 04/25/2015   MMR 08/11/2010   Meningococcal Conjugate 08/11/2010   Moderna Sars-Covid-2 Vaccination 02/26/2020, 05/08/2020   Pneumococcal-Unspecified 04/25/2015   Tdap 08/06/2010, 05/26/2015   Varicella 08/11/2010    Past Medical History:  Diagnosis Date   Achilles tendonitis 2016   right leg   Sickle cell trait     Tobacco History: Social History   Tobacco Use  Smoking Status Never  Smokeless Tobacco Never   Counseling given: Not Answered   Outpatient Medications Prior to Visit  Medication Sig Dispense Refill   acetaminophen  (TYLENOL ) 500 MG tablet Take 2 tablets (1,000 mg total) by mouth every 8 (eight) hours as needed for fever. 30 tablet 0   ondansetron  (ZOFRAN -ODT) 4 MG disintegrating tablet Take 1 tablet (4 mg total) by mouth every 8 (eight) hours as needed for nausea or vomiting. 10 tablet 0   No facility-administered medications prior to  visit.   Review of Systems  Review of Systems  Constitutional: Negative.   Respiratory: Negative.     Physical Exam  There were no vitals taken for this visit. Physical Exam Constitutional:      Appearance: Normal appearance. He is well-developed.  HENT:     Head: Normocephalic and atraumatic.     Mouth/Throat:     Mouth: Mucous membranes are moist.     Pharynx: Oropharynx is clear.  Cardiovascular:     Rate and Rhythm:  Normal rate and regular rhythm.     Heart sounds: Normal heart sounds.  Pulmonary:     Effort: Pulmonary effort is normal. No respiratory distress.     Breath sounds: Normal breath sounds. No wheezing or rhonchi.     Comments: CTA Musculoskeletal:        General: Normal range of motion.     Cervical back: Normal range of motion and neck supple.  Skin:    General: Skin is warm and dry.     Findings: No erythema or rash.  Neurological:     General: No focal deficit present.     Mental Status: He is alert and oriented to person, place, and time. Mental status is at baseline.  Psychiatric:        Mood and Affect: Mood normal.        Behavior: Behavior normal.        Thought Content: Thought content normal.        Judgment: Judgment normal.      Lab Results:  CBC    Component Value Date/Time   WBC 6.4 10/06/2015 0001   RBC 5.06 10/06/2015 0001   HGB 15.3 03/26/2017 1229   HCT 45.0 03/26/2017 1229   PLT 569 (H) 10/06/2015 0001   MCV 87.2 10/06/2015 0001   MCH 29.4 10/06/2015 0001   MCHC 33.8 10/06/2015 0001   RDW 13.8 10/06/2015 0001   LYMPHSABS 1.8 10/06/2015 0001   MONOABS 0.5 10/06/2015 0001   EOSABS 0.4 10/06/2015 0001   BASOSABS 0.1 10/06/2015 0001    BMET    Component Value Date/Time   NA 141 03/26/2017 1229   K 4.2 03/26/2017 1229   CL 103 03/26/2017 1229   CO2 28 10/06/2015 0001   GLUCOSE 88 03/26/2017 1229   BUN 5 (L) 03/26/2017 1229   CREATININE 1.30 (H) 03/26/2017 1229   CREATININE 1.22 10/06/2015 0001   CALCIUM 9.7 10/06/2015 0001   GFRNONAA >60 12/15/2014 1517   GFRAA >60 12/15/2014 1517    BNP    Component Value Date/Time   BNP 31.3 03/26/2017 1050    ProBNP No results found for: PROBNP  Imaging: No results found.   Assessment & Plan:   No problem-specific Assessment & Plan notes found for this encounter.   There are no diagnoses linked to this encounter.  Assessment and Plan Assessment & Plan Obstructive sleep  apnea Diagnosed in November last year with a sleep study showing 20 apneic episodes, longest lasting about a minute. Currently using a CPAP machine but experiencing discomfort with the full face mask, leading to inconsistent use. Reports feeling unable to breathe adequately with the current mask and pressure settings. No recent follow-up in the [place] in [place]. Weight gain of approximately 30 pounds since last evaluation. Reports improved energy levels when using CPAP, but discomfort during sleep. Discussed potential for titration study if compliance report and sleep study data are unavailable. - Ordered compliance report from CPAP machine  to assess pressure settings and usage. - Referred to local DME company for mask fitting and CPAP supply renewal. - Changed CPAP settings to auto mode with a pressure range of 5 to 15 cm H2O. - Provided information on CPAP maintenance and cleaning. - Scheduled follow-up appointment in 4-6 weeks to assess CPAP usage and comfort with new settings and mask.  Recording duration: 11 minutes    Almarie LELON Ferrari, NP 06/03/2024

## 2024-06-03 NOTE — Patient Instructions (Addendum)
 VISIT SUMMARY: You came in today for a consultation regarding your sleep apnea. You have been using a CPAP machine since your diagnosis last November, but you have been experiencing discomfort with the full face mask, leading to inconsistent use. You also mentioned that you have not had a follow-up since returning to the [place] in [place] and have gained some weight since then.  YOUR PLAN: -OBSTRUCTIVE SLEEP APNEA: Obstructive sleep apnea is a condition where your airway becomes blocked during sleep, causing you to stop breathing temporarily. We have ordered a compliance report from your CPAP machine to check the pressure settings and usage. You have been referred to a local DME company for a new mask fitting and to renew your CPAP supplies. We have also changed your CPAP settings to auto mode with a pressure range of 5 to 15 cm H2O. Additionally, you were provided with information on how to maintain and clean your CPAP machine.  INSTRUCTIONS: Please follow up in 4-6 weeks to assess your CPAP usage and comfort with the new settings and mask.  Orders: Establish with local DME company for   CPAP and BIPAP Information CPAP and BIPAP use air pressure to keep your airways open and help you breathe well. CPAP and BIPAP use different amounts of pressure. Your health care provider will tell you whether CPAP or BIPAP would be best for you. CPAP stands for continuous positive airway pressure. With CPAP, the amount of pressure stays the same while you breathe in and out. BIPAP stands for bi-level positive airway pressure. With BIPAP, the amount of pressure will be higher when you breathe in and lower when you breathe out. This allows you to take bigger breaths. CPAP or BIPAP may be used in the hospital or at home. You may need to have a sleep study before your provider can order a device for you to use at home. What are the advantages? CPAP and BIPAP are most often used for obstructive sleep apnea to keep  the airways from collapsing when the muscles relax during sleep. CPAP or BIPAP can be used if you have: Chronic obstructive pulmonary disease. Heart failure. Medical conditions that cause muscle weakness. Other problems that cause breathing to be shallow, weak, or difficult. What are the risks? Your provider will talk with you about risks. These may include: Sores on your nose or face caused from the mask, prongs, or nasal pillows. Dry or stuffy nose or nosebleeds. Feeling gassy or bloated. Sinus or lung infection if the equipment is not cleaned well. When should CPAP or BIPAP be used? In most cases, CPAP or BIPAP is used during sleep at night or whenever the main sleep time happens. It's also used during naps. People with some medical conditions may need to wear the mask when they're awake. Follow instructions from your provider about when to use your CPAP or BIPAP. What happens during CPAP or BIPAP?  Both CPAP and BIPAP use a small machine that uses electricity to create air pressure. A long tube connects the device to a plastic mask. Air is blown through the mask into your nose or mouth. The amount of pressure that's used to blow the air can be adjusted. Your provider will set the pressure setting and help you find the best mask for you. Tips for using the mask There are different types and sizes of masks. If your mask does not fit well, talk with your provider about getting a different one. Some common types of masks include:  Full face masks, which fit over the mouth and nose. Nasal masks, which fit over the nose. Nasal pillow or prong masks, which fit into the nostrils. The mask needs to be snug to your face, so some people feel trapped or closed in at first. If you feel this way, you may need to get used to the mask. Hold the mask loosely over your nose or mouth and then gradually put the the mask on more snugly. Slowly increase the amount of time you use the mask. If you have trouble  with your mask not fitting well or leaking, talk with your provider. Do not stop using the mask. Tips for using the device Follow instructions from your provider about how to and how often to use the device. For home use, CPAP and BIPAP devices come from home health care companies. There are many different brands. Your health insurance company will help to decide which device you get. Keep the CPAP or BIPAP device and attachments clean. Ask your home health care company or check the instruction book for cleaning instructions. Make sure the humidifier is filled with germ-free (sterile) water and is working correctly. This will help prevent a dry or stuffy nose or nosebleeds. A nasal saline mist or spray may keep your nose from getting dry and sore. Do not eat or drink while the CPAP or BIPAP device is on. Food or drinks could get pushed into your lungs by the pressure of the CPAP or BIPAP. Follow these instructions at home: Take over-the-counter and prescription medicines only as told by your provider. Do not smoke, vape, or use nicotine or tobacco. Contact a health care provider if: You have redness or pressure sores on your head, face, mouth, or nose from the mask or headgear. You have trouble using the CPAP or BIPAP device. You have trouble going to sleep or staying asleep. Someone tells you that you snore even when wearing your CPAP or BIPAP device. Get help right away if: You have trouble breathing. You feel confused. These symptoms may be an emergency. Get help right away. Call 911. Do not wait to see if the symptoms will go away. Do not drive yourself to the hospital. This information is not intended to replace advice given to you by your health care provider. Make sure you discuss any questions you have with your health care provider. Document Revised: 11/02/2022 Document Reviewed: 11/02/2022 Elsevier Patient Education  2024 [employer].

## 2024-07-12 ENCOUNTER — Telehealth: Payer: Self-pay

## 2024-07-12 NOTE — Telephone Encounter (Signed)
 Called and left message or brad to see if he would be able to obtain patient cpap download

## 2024-07-12 NOTE — Telephone Encounter (Signed)
 Called and spoke to patient to see if they could bring in machine to appointment,[facility] is waiting on sleep study will fax today,patient informed he cannot make the appointment on 07/15/2024 I have rescheduled patient for Jan 2nd for a virtual visit,informed patient to bring in sd card in order to pull download on either 30th or 31st prior to appointment so that we have the download for virtual visit,will place reminder in mychart to bring in sd card prior to appointment.

## 2024-07-15 ENCOUNTER — Ambulatory Visit: Admitting: Primary Care

## 2024-07-15 ENCOUNTER — Telehealth: Payer: Self-pay

## 2024-07-15 NOTE — Telephone Encounter (Signed)
 Faxed sleep study over to [facility] per their request

## 2024-07-24 ENCOUNTER — Telehealth: Payer: Self-pay

## 2024-07-24 NOTE — Telephone Encounter (Signed)
 Pt is scheduled to see Landry Ferrari., NP on Friday, 07-26-24 for a cpap f/u. I could not find pt in [facility]. I called and spoke to [facility] with [facility]. Arvella stated the pt has not been set up yet as they were waiting on a copy of the sleep study, however he did see where it was uploaded so he will reach out to pt regarding the set up.   Pt's appt on Friday will need to be cancelled and rescheduled. [facility] pt x1. lmtcb

## 2024-07-26 ENCOUNTER — Telehealth: Admitting: Primary Care

## 2024-07-26 NOTE — Progress Notes (Signed)
 This encounter was created in error - please disregard.

## 2024-08-08 ENCOUNTER — Ambulatory Visit
Admission: EM | Admit: 2024-08-08 | Discharge: 2024-08-08 | Disposition: A | Discharge status: Home / Self Care | Attending: Emergency Medicine | Admitting: Emergency Medicine

## 2024-08-08 DIAGNOSIS — J069 Acute upper respiratory infection, unspecified: Secondary | ICD-10-CM | POA: Diagnosis not present

## 2024-08-08 MED ORDER — AMOXICILLIN-POT CLAVULANATE 875-125 MG PO TABS: 1.0000 | ORAL_TABLET | Freq: Two times a day (BID) | ORAL | 0 refills | Status: AC

## 2024-08-08 MED ORDER — PREDNISONE 10 MG (21) PO TBPK: ORAL_TABLET | Freq: Every day | ORAL | 0 refills | Status: AC

## 2024-08-08 MED ORDER — PROMETHAZINE-DM 6.25-15 MG/5ML PO SYRP: 5.0000 mL | ORAL_SOLUTION | Freq: Four times a day (QID) | ORAL | 0 refills | Status: AC | PRN

## 2024-08-08 NOTE — Discharge Instructions (Signed)
 Today you are evaluated for your upper respiratory symptoms and on exam lungs do sound congested and oxygen level is fluctuating which would be concerning for possible pneumonia and therefore you be started on antibiotics  Take Augmentin  twice daily for 7 days  Begin prednisone  every morning with food as directed to open and relax the airway  You may continue use of benzonatate  as needed but may use cough syrup every 6 hours in addition please be mindful can make you feel drowsy    You can take Tylenol  and/or Ibuprofen  as needed for fever reduction and pain relief.   For cough: honey 1/2 to 1 teaspoon (you can dilute the honey in water or another fluid).  You can also use guaifenesin and dextromethorphan  for cough. You can use a humidifier for chest congestion and cough.  If you don't have a humidifier, you can sit in the bathroom with the hot shower running.      For sore throat: try warm salt water gargles, cepacol lozenges, throat spray, warm tea or water with lemon/honey, popsicles or ice, or OTC cold relief medicine for throat discomfort.   For congestion: take a daily anti-histamine like Zyrtec, Claritin, and a oral decongestant, such as pseudoephedrine.  You can also use Flonase 1-2 sprays in each nostril daily.   It is important to stay hydrated: drink plenty of fluids (water, gatorade/powerade/pedialyte, juices, or teas) to keep your throat moisturized and help further relieve irritation/discomfort.   If symptoms do not improve or at any point worsen please follow-up for reevaluation

## 2024-08-08 NOTE — ED Triage Notes (Addendum)
 Pt present with c/o a lingering cold x 2 weeks. States the cough will not go away. Pt was prescribed albuterol  inhaler and tessalon  for relief. Pt states it has not helped.  Home interventions: OTC cough medicine, cough syrup, mucinex, throat lozenges, theraflu  Pt states the last time he used his inhaler was this morning

## 2024-08-08 NOTE — ED Provider Notes (Signed)
 " [facility]    CSN: [id number] Arrival date & time: 08/08/24  1242      History   Chief Complaint Chief Complaint  Patient presents with   Cough    HPI Adam Prince is a 39 y.o. male.   Patient presents for evaluation of nasal congestion, right-sided ear fullness, coughing, shortness of breath with exertion and wheezing present for 2 weeks.  Cough has been productive today.  Associated sinus pressure to the bridge of the nose.  Completed e-visit 1 day ago was given albuterol  inhaler and benzonatate  has seen minimal improvement.  Additionally has attempted use of over-the-counter cough syrup, Mucinex throat lozenges and TheraFlu.  Denies respiratory history, non-smoker.  No sick contact prior with exposure to influenza.  Denies fever.   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 Authorizing Provider  acetaminophen  (TYLENOL ) 500 MG tablet Take 2 tablets (1,000 mg total) by mouth every 8 (eight) hours as needed for fever. 10/13/17   Keith Sor, PA-C  aspirin  EC 81 MG tablet Take 81 mg by mouth. 02/14/24 05/02/25  [provider]  benzonatate  (TESSALON ) 100 MG capsule Take 100-200 mg by mouth 2 (two) times daily.    [provider]  ondansetron  (ZOFRAN -ODT) 4 MG disintegrating tablet Take 1 tablet  (4 mg total) by mouth every 8 (eight) hours as needed for nausea or vomiting. 12/03/21   Vonna Sharlet POUR, MD  predniSONE  (DELTASONE ) 50 MG tablet Take 50 mg by mouth daily. 05/30/24   [provider]    Family History Family History  Problem Relation Age of Onset   Atrial fibrillation Mother    Asthma Brother     Social History Social History[1]   Allergies   Sulfa antibiotics   Review of Systems Review of Systems  HENT:  Positive for congestion and ear pain. Negative for dental problem, drooling, ear discharge, facial swelling, hearing loss, mouth sores, nosebleeds, postnasal drip, rhinorrhea, sinus pressure, sinus pain, sneezing, sore throat, tinnitus, trouble swallowing and voice change.   Respiratory:  Positive for cough, shortness of breath and wheezing. Negative for apnea, choking, chest tightness and stridor.      Physical Exam Triage Vital Signs ED Triage Vitals  Encounter Vitals Group     BP 08/08/24 1256 115/86     Girls Systolic BP Percentile --      Girls Diastolic BP Percentile --      Boys Systolic BP Percentile --      Boys Diastolic BP Percentile --      Pulse Rate 08/08/24 1256 78     Resp 08/08/24 1256 17     Temp 08/08/24 1256 98.7 F (37.1 C)     Temp Source 08/08/24 1256 Oral  SpO2 08/08/24 1256 95 %     Weight --      Height --      Head Circumference --      Peak Flow --      Pain Score 08/08/24 1255 0     Pain Loc --      Pain Education --      Exclude from Growth Chart --    No data found.  Updated Vital Signs BP 115/86   Pulse 78   Temp 98.7 F (37.1 C) (Oral)   Resp 17   SpO2 95%   Visual Acuity Right Eye Distance:   Left Eye Distance:   Bilateral Distance:    Right Eye Near:   Left Eye Near:    Bilateral Near:     Physical Exam Constitutional:      Appearance: Normal appearance.  HENT:     Right Ear: Tympanic membrane, ear canal and external ear normal.     Left Ear: Tympanic membrane, ear canal and  external ear normal.     Nose: Congestion present.     Mouth/Throat:     Pharynx: No oropharyngeal exudate or posterior oropharyngeal erythema.  Eyes:     Extraocular Movements: Extraocular movements intact.  Cardiovascular:     Rate and Rhythm: Normal rate and regular rhythm.     Pulses: Normal pulses.     Heart sounds: Normal heart sounds.  Pulmonary:     Effort: Pulmonary effort is normal.     Breath sounds: Rhonchi present.  Neurological:     Mental Status: He is alert and oriented to person, place, and time. Mental status is at baseline.      UC Treatments / Results  Labs (all labs ordered are listed, but only abnormal results are displayed) Labs Reviewed - No data to display  EKG   Radiology No results found.  Procedures Procedures (including critical care time)  Medications Ordered in UC Medications - No data to display  Initial Impression / Assessment and Plan / UC Course  I have reviewed the triage vital signs and the nursing notes.  Pertinent labs & imaging results that were available during my care of the patient were reviewed by me and considered in my medical decision making (see chart for details).  Acute URI  Symptoms persisting for 2 weeks concerning for bacterial presents, rhonchi noted to auscultation, not clearing with forceful coughing, O2 saturation fluctuating between 89% and 95% on room air, patient in no signs of distress nontoxic-appearing, stable for outpatient management, initiating antibiotics, prescribed Augmentin  and discussed administration additionally for management of shortness of breath prednisone  prescribed and advised continuation of inhaler as needed prescribed addition of Promethazine  DM cough syrup as patient endorses difficulty resting at nighttime, recommended over-the-counter medication and nonpharmacological measures, given strict follow-up precautions that if no improvement seen or at any point symptoms to worsen he is to  follow-up for reevaluation, verbalized understanding Final Clinical Impressions(s) / UC Diagnoses   Final diagnoses:  None   Discharge Instructions   None    ED Prescriptions   None    PDMP not reviewed this encounter.     [1]  Social History Tobacco Use   Smoking status: Never   Smokeless tobacco: Never  Vaping Use   Vaping status: Never Used  Substance Use Topics   Alcohol use: Yes    Comment: social   Drug use: No     Teresa Shelba SAUNDERS, NP 08/08/24 1325  "

## 2024-08-15 ENCOUNTER — Ambulatory Visit
Admission: EM | Admit: 2024-08-15 | Discharge: 2024-08-15 | Disposition: A | Discharge status: Home / Self Care | Attending: Family Medicine | Admitting: Family Medicine

## 2024-08-15 DIAGNOSIS — R079 Chest pain, unspecified: Secondary | ICD-10-CM | POA: Diagnosis not present

## 2024-08-15 NOTE — Discharge Instructions (Signed)
 Unfortunately since all her partner urgent cares are closed now I cannot pursue a chest x-ray.  Highly recommend you present to the emergency room to rule out pneumonia or pulmonary infection given your recent illness and treatment failure with an antibiotic course and steroids.  Please head to the emergency room now.

## 2024-08-15 NOTE — ED Provider Notes (Signed)
 " [facility], Television/film/[facility] - [facility]  Note:  This document was prepared using Conservation officer, historic buildings and may include unintentional dictation errors.  MRN: [id number] DOB: 02-21-1986  Subjective:   Adam Prince is a 39 y.o. male presenting for acute onset today of left lower lateral chest pain.  Patient is still getting through a cold, was seen last week and started on Augmentin  and a prednisone  pack.  Completed the course.  Reports a history of asthma and states he is using an inhaler now.  No smoking of any kind including cigarettes, cigars, vaping, marijuana use.  No drug use.  No history of cardiovascular disease.  Had a video visit earlier today at 4 PM and was advised to seek an in person visit.  Patient presents 5 minutes prior to our clinic closing.   Current Outpatient Medications  Medication Instructions   acetaminophen  (TYLENOL ) 1,000 mg, Oral, Every 8 hours PRN   amoxicillin -clavulanate (AUGMENTIN ) 875-125 MG tablet 1 tablet, Oral, Every 12 hours   aspirin  EC 81 mg   benzonatate  (TESSALON ) 100-200 mg, 2 times daily   ondansetron  (ZOFRAN -ODT) 4 mg, Oral, Every 8 hours PRN   predniSONE  (STERAPRED UNI-PAK 21 TAB) 10 MG (21) TBPK tablet Oral, Daily, Take 6 tabs by mouth daily  for 1 days, then 5 tabs for 1 days, then 4 tabs for 1 days, then 3 tabs for 1 days, 2 tabs for 1 days, then 1 tab by mouth daily for 1 days   promethazine -dextromethorphan  (PROMETHAZINE -DM) 6.25-15 MG/5ML syrup 5 mLs, Oral, 4 times daily PRN    Allergies[1]  Past Medical History:  Diagnosis Date   Achilles tendonitis 2016   right leg   Sickle cell trait      Past Surgical History:  Procedure Laterality Date   ACHILLES TENDON SURGERY Right 10/03/2019   Procedure: ACHILLES TENDON REPAIR;  Surgeon: Kit Rush, MD;  Location: [facility];  Service: Orthopedics;  Laterality: Right;   CHOLECYSTECTOMY     CHOLECYSTECTOMY  01/2019   KNEE SURGERY     TYMPANOSTOMY TUBE PLACEMENT       Family History  Problem Relation Age of Onset   Atrial fibrillation Mother    Asthma Brother     Social History   Occupational History   Not on file  Tobacco Use   Smoking status: Never   Smokeless tobacco: Never  Vaping Use   Vaping status: Never Used  Substance and Sexual Activity   Alcohol use: Yes    Comment: social   Drug use: No   Sexual activity: Not on file     ROS   Objective:   Vitals: BP 127/86 (BP Location: Right Arm)   Pulse 92   Temp 98.1 F (36.7 C) (Oral)   Resp 18   Ht 5' 7 (1.702 m)   Wt 215 lb (97.5 kg)   SpO2 98%   BMI 33.67 kg/m   Physical Exam Constitutional:      General: He is not in acute distress.    Appearance: Normal appearance. He is well-developed. He is not ill-appearing, toxic-appearing or diaphoretic.  HENT:     Head: Normocephalic and atraumatic.     Right Ear: External ear normal.     Left Ear: External ear normal.     Nose: Nose normal.     Mouth/Throat:     Mouth: Mucous membranes are moist.  Eyes:     General: No scleral icterus.  Right eye: No discharge.        Left eye: No discharge.     Extraocular Movements: Extraocular movements intact.  Cardiovascular:     Rate and Rhythm: Normal rate and regular rhythm.     Heart sounds: Normal heart sounds. No murmur heard.    No friction rub. No gallop.  Pulmonary:     Effort: Pulmonary effort is normal. No respiratory distress.     Breath sounds: No stridor. No wheezing, rhonchi or rales.     Comments: Slight decrease in lung sounds. Neurological:     Mental Status: He is alert and oriented to person, place, and time.  Psychiatric:        Mood and Affect: Mood normal.        Behavior: Behavior normal.        Thought Content: Thought content normal.    ED ECG REPORT   Date: 08/15/2024  EKG Time: 7:58 PM  Rate: 79bpm  Rhythm: normal sinus rhythm  Axis: normal  Intervals:none  ST&T Change: none  Narrative Interpretation: Sinus rhythm at 79 bpm.  No  acute findings.   Assessment and Plan :   PDMP not reviewed this encounter.  1. Left-sided chest pain      Patient presents as our clinic is closing.  Unfortunately that means I cannot pursue an outpatient x-ray as our partner urgent cares will also be closing.  Patient was informed and recommend he present to the emergency room for further testing that we can provide since our clinic is closing.  He has already undergone Augmentin  and prednisone  and is now presenting with left-sided chest pain.  EKG reassuring and without acute findings.    [1]  Allergies Allergen Reactions   Sulfa Antibiotics Other (See Comments)    Childhood reaction     Christopher Savannah, PA-C 08/15/24 1959  "

## 2024-08-15 NOTE — ED Triage Notes (Signed)
 Pt states that he has some left sided chest pain x1 day

## 2024-08-21 ENCOUNTER — Telehealth: Payer: Self-pay

## 2024-08-21 NOTE — Telephone Encounter (Signed)
 CMN recived from [facility] [employer] regarding pt's cpap supplies. This is only requiring a provider signature. I will fax once signed.

## 2024-08-21 NOTE — Telephone Encounter (Signed)
 This has been signed and faxed. NFN

## 2024-09-06 ENCOUNTER — Ambulatory Visit: Admitting: Primary Care
